# Patient Record
Sex: Male | Born: 1980 | Race: Black or African American | Hispanic: No | Marital: Married | Smoking: Never smoker
Health system: Southern US, Community
[De-identification: ages and names within clinical notes are randomized; demographics above are authoritative.]

## PROBLEM LIST (undated history)

## (undated) DIAGNOSIS — E66813 Obesity, class 3: Secondary | ICD-10-CM

## (undated) DIAGNOSIS — I509 Heart failure, unspecified: Secondary | ICD-10-CM

## (undated) DIAGNOSIS — I10 Essential (primary) hypertension: Secondary | ICD-10-CM

## (undated) DIAGNOSIS — E785 Hyperlipidemia, unspecified: Secondary | ICD-10-CM

## (undated) DIAGNOSIS — M1990 Unspecified osteoarthritis, unspecified site: Secondary | ICD-10-CM

## (undated) HISTORY — DX: Heart failure, unspecified: I50.9

## (undated) HISTORY — PX: ANKLE SURGERY: SHX546

## (undated) HISTORY — DX: Obesity, class 3: E66.813

## (undated) HISTORY — DX: Hyperlipidemia, unspecified: E78.5

## (undated) HISTORY — PX: OTHER SURGICAL HISTORY: SHX169

## (undated) HISTORY — DX: Morbid (severe) obesity due to excess calories: E66.01

## (undated) HISTORY — DX: Unspecified osteoarthritis, unspecified site: M19.90

## (undated) HISTORY — DX: Essential (primary) hypertension: I10

---

## 2004-05-02 ENCOUNTER — Emergency Department: Payer: Self-pay | Admitting: Emergency Medicine

## 2005-10-16 ENCOUNTER — Ambulatory Visit: Payer: Self-pay | Admitting: Orthopaedic Surgery

## 2008-08-02 ENCOUNTER — Emergency Department: Payer: Self-pay | Admitting: Emergency Medicine

## 2010-04-30 ENCOUNTER — Emergency Department: Payer: Self-pay | Admitting: Unknown Physician Specialty

## 2010-11-26 ENCOUNTER — Emergency Department: Payer: Self-pay | Admitting: Emergency Medicine

## 2012-05-09 ENCOUNTER — Emergency Department: Payer: Self-pay | Admitting: Unknown Physician Specialty

## 2012-05-09 LAB — CK TOTAL AND CKMB (NOT AT ARMC): CK-MB: 2.4 ng/mL (ref 0.5–3.6)

## 2012-05-09 LAB — COMPREHENSIVE METABOLIC PANEL
Anion Gap: 7 (ref 7–16)
Bilirubin,Total: 0.4 mg/dL (ref 0.2–1.0)
Calcium, Total: 8.9 mg/dL (ref 8.5–10.1)
Chloride: 103 mmol/L (ref 98–107)
Creatinine: 1.21 mg/dL (ref 0.60–1.30)
Glucose: 83 mg/dL (ref 65–99)
Potassium: 3.8 mmol/L (ref 3.5–5.1)
SGPT (ALT): 36 U/L (ref 12–78)
Total Protein: 7.6 g/dL (ref 6.4–8.2)

## 2012-05-09 LAB — CBC
MCH: 28.1 pg (ref 26.0–34.0)
Platelet: 216 10*3/uL (ref 150–440)
RBC: 4.73 10*6/uL (ref 4.40–5.90)
RDW: 12.9 % (ref 11.5–14.5)

## 2013-04-05 ENCOUNTER — Emergency Department: Payer: Self-pay | Admitting: Emergency Medicine

## 2013-04-06 LAB — CBC
HCT: 40.8 % (ref 40.0–52.0)
HGB: 13.7 g/dL (ref 13.0–18.0)
MCH: 29.1 pg (ref 26.0–34.0)
MCHC: 33.6 g/dL (ref 32.0–36.0)
MCV: 87 fL (ref 80–100)
Platelet: 233 10*3/uL (ref 150–440)
RBC: 4.7 10*6/uL (ref 4.40–5.90)
RDW: 13 % (ref 11.5–14.5)
WBC: 7.9 10*3/uL (ref 3.8–10.6)

## 2013-04-06 LAB — BASIC METABOLIC PANEL
ANION GAP: 4 — AB (ref 7–16)
BUN: 17 mg/dL (ref 7–18)
CALCIUM: 9.1 mg/dL (ref 8.5–10.1)
Chloride: 105 mmol/L (ref 98–107)
Co2: 29 mmol/L (ref 21–32)
Creatinine: 1.18 mg/dL (ref 0.60–1.30)
EGFR (African American): >60
GLUCOSE: 88 mg/dL (ref 65–99)
Osmolality: 277 (ref 275–301)
Potassium: 3.7 mmol/L (ref 3.5–5.1)
SODIUM: 138 mmol/L (ref 136–145)

## 2013-04-06 LAB — TROPONIN I

## 2014-04-28 ENCOUNTER — Ambulatory Visit
Admit: 2014-04-28 | Disposition: A | Discharge status: Home / Self Care | Payer: Self-pay | Attending: Family Medicine | Admitting: Family Medicine

## 2014-08-17 ENCOUNTER — Emergency Department: Payer: BLUE CROSS/BLUE SHIELD

## 2014-08-17 ENCOUNTER — Emergency Department
Admission: EM | Admit: 2014-08-17 | Discharge: 2014-08-17 | Disposition: A | Discharge status: Home / Self Care | Payer: BLUE CROSS/BLUE SHIELD | Attending: Student | Admitting: Student

## 2014-08-17 ENCOUNTER — Encounter: Payer: Self-pay | Admitting: Emergency Medicine

## 2014-08-17 DIAGNOSIS — M5441 Lumbago with sciatica, right side: Secondary | ICD-10-CM | POA: Insufficient documentation

## 2014-08-17 DIAGNOSIS — K625 Hemorrhage of anus and rectum: Secondary | ICD-10-CM

## 2014-08-17 DIAGNOSIS — K649 Unspecified hemorrhoids: Secondary | ICD-10-CM | POA: Insufficient documentation

## 2014-08-17 DIAGNOSIS — M545 Low back pain: Secondary | ICD-10-CM | POA: Diagnosis present

## 2014-08-17 MED ORDER — TRAMADOL HCL 50 MG PO TABS
50.0000 mg | ORAL_TABLET | Freq: Four times a day (QID) | ORAL | Status: DC | PRN
Start: 2014-08-17 — End: 2015-08-15

## 2014-08-17 MED ORDER — HYDROCORTISONE ACETATE 25 MG RE SUPP: 25.0000 mg | Freq: Two times a day (BID) | RECTAL | Status: DC

## 2014-08-17 MED ORDER — CYCLOBENZAPRINE HCL 10 MG PO TABS: 10.0000 mg | ORAL_TABLET | Freq: Three times a day (TID) | ORAL | Status: DC | PRN

## 2014-08-17 MED ORDER — IBUPROFEN 800 MG PO TABS: 800.0000 mg | ORAL_TABLET | Freq: Three times a day (TID) | ORAL | Status: DC | PRN

## 2014-08-17 NOTE — ED Notes (Signed)
C/o low back pain past 3 days, also states his hemmoroids are sticking out

## 2014-08-17 NOTE — ED Notes (Signed)
Lower back pain with pain into right leg .

## 2014-08-17 NOTE — ED Notes (Signed)
Patient transported to X-ray 

## 2014-08-17 NOTE — ED Provider Notes (Signed)
[facility] Emergency Department Provider Note  ____________________________________________  Time seen: Approximately 8:39 AM  I have reviewed the triage vital signs and the nursing notes.   HISTORY  Chief Complaint Back Pain    HPI John Lowe is a 34 y.o. male complaining of 3 days of radicular back pain to the right lower extremity. Patient denies any provocative incident for this. Patient denies any loss of bowel or bladder function. Patient admits to couple days of heavy lifting at work. Patient rates pain as 8/10 describes it as being sharp. Denies any loss of strength to the lower extremity. Patient also complaining of rectal hemorrhoids. Patient stated there is no bleeding from hemorrhoids but his discomfort. Patient use over-the-counter medication for hemorrhoids.   History reviewed. No pertinent past medical history.  There are no active problems to display for this patient.   History reviewed. No pertinent past surgical history.  Current Outpatient Rx  Name  Route  Sig  Dispense  Refill  . cyclobenzaprine (FLEXERIL) 10 MG tablet   Oral   Take 1 tablet (10 mg total) by mouth every 8 (eight) hours as needed for muscle spasms.   15 tablet   0   . hydrocortisone (ANUSOL-HC) 25 MG suppository   Rectal   Place 1 suppository (25 mg total) rectally every 12 (twelve) hours.   12 suppository   1   . ibuprofen (ADVIL,MOTRIN) 800 MG tablet   Oral   Take 1 tablet (800 mg total) by mouth every 8 (eight) hours as needed for moderate pain.   15 tablet   0   . traMADol (ULTRAM) 50 MG tablet   Oral   Take 1 tablet (50 mg total) by mouth every 6 (six) hours as needed for moderate pain.   12 tablet   0     Allergies Review of patient's allergies indicates not on file.  No family history on file.  Social History History  Substance Use Topics  . Smoking status: Never Smoker   . Smokeless tobacco: Not on file  . Alcohol Use: No     Review of Systems Constitutional: No fever/chills Eyes: No visual changes. ENT: No sore throat. Cardiovascular: Denies chest pain. Respiratory: Denies shortness of breath. Gastrointestinal: No abdominal pain.  No nausea, no vomiting.  No diarrhea.  No constipation. Discomfort from hemorrhoids. Genitourinary: Negative for dysuria. Musculoskeletal: Positive back pain. Skin: Negative for rash. Neurological: Negative for headaches, focal weakness or numbness. 10-point ROS otherwise negative.  ____________________________________________   PHYSICAL EXAM:  VITAL SIGNS: ED Triage Vitals  Enc Vitals Group     BP 08/17/14 0829 173/95 mmHg     Pulse Rate 08/17/14 0829 80     Resp 08/17/14 0829 20     Temp 08/17/14 0829 97.7 F (36.5 C)     Temp Source 08/17/14 0829 Oral     SpO2 08/17/14 0829 97 %     Weight 08/17/14 0829 345 lb (156.491 kg)     Height 08/17/14 0829  (1.905 m)     Head Cir --      Peak Flow --      Pain Score 08/17/14 0829 8     Pain Loc --      Pain Edu? --      Excl. in GC? --     Constitutional: Alert and oriented. Well appearing and in no acute distress. Eyes: Conjunctivae are normal. PERRL. EOMI. Head: Atraumatic. Nose: No congestion/rhinnorhea. Mouth/Throat: Mucous membranes are moist.  Oropharynx non-erythematous. Neck: No stridor.  No deformity for nuchal range of motion nontender palpation. Hematological/Lymphatic/Immunilogical: No cervical lymphadenopathy. Cardiovascular: Normal rate, regular rhythm. Grossly normal heart sounds.  Good peripheral circulation. Respiratory: Normal respiratory effort.  No retractions. Lungs CTAB. Gastrointestinal: Soft and nontender. No distention. No abdominal bruits. No CVA tenderness. Nonthrombosed rectal hemorrhoid Musculoskeletal: No lower extremity tenderness nor edema.  No joint effusions. Neurologic:  Normal speech and language. No gross focal neurologic deficits are appreciated. Speech is normal. No  gait instability. Skin:  Skin is warm, dry and intact. No rash noted. Psychiatric: Mood and affect are normal. Speech and behavior are normal.  ____________________________________________   LABS (all labs ordered are listed, but only abnormal results are displayed)  Labs Reviewed - No data to display ____________________________________________  EKG   ____________________________________________  RADIOLOGY no acute findings ____________________________________________   PROCEDURES  Procedure(s) performed: None  Critical Care performed: No  ____________________________________________   INITIAL IMPRESSION / ASSESSMENT AND PLAN / ED COURSE  Pertinent labs & imaging results that were available during my care of the patient were reviewed by me and considered in my medical decision making (see chart for details).  Radicular low back pain. Rectal hemorrhoids. ____________________________________________   FINAL CLINICAL IMPRESSION(S) / ED DIAGNOSES  Final diagnoses:  Right-sided low back pain with right-sided sciatica  Hemorrhage, anal or rectal      Joni Reining, PA-C 08/17/14 2229  Gayla Doss, MD 08/19/14 [phone]

## 2014-08-17 NOTE — Discharge Instructions (Signed)
Back Pain, Adult °Back pain is very common. The pain often gets better over time. The cause of back pain is usually not dangerous. Most people can learn to manage their back pain on their own.  °HOME CARE  °· Stay active. Start with short walks on flat ground if you can. Try to walk farther each day. °· Do not sit, drive, or stand in one place for more than 30 minutes. Do not stay in bed. °· Do not avoid exercise or work. Activity can help your back heal faster. °· Be careful when you bend or lift an object. Bend at your knees, keep the object close to you, and do not twist. °· Sleep on a firm mattress. Lie on your side, and bend your knees. If you lie on your back, put a pillow under your knees. °· Only take medicines as told by your doctor. °· Put ice on the injured area. °¨ Put ice in a plastic bag. °¨ Place a towel between your skin and the bag. °¨ Leave the ice on for 15-20 minutes, 03-04 times a day for the first 2 to 3 days. After that, you can switch between ice and heat packs. °· Ask your doctor about back exercises or massage. °· Avoid feeling anxious or stressed. Find good ways to deal with stress, such as exercise. °GET HELP RIGHT AWAY IF:  °· Your pain does not go away with rest or medicine. °· Your pain does not go away in 1 week. °· You have new problems. °· You do not feel well. °· The pain spreads into your legs. °· You cannot control when you poop (bowel movement) or pee (urinate). °· Your arms or legs feel weak or lose feeling (numbness). °· You feel sick to your stomach (nauseous) or throw up (vomit). °· You have belly (abdominal) pain. °· You feel like you may pass out (faint). °MAKE SURE YOU:  °· Understand these instructions. °· Will watch your condition. °· Will get help right away if you are not doing well or get worse. °Document Released: 08/01/2007 Document Revised: 05/07/2011 Document Reviewed: 06/16/2013 °ExitCare® Patient Information ©2015 [employer]. This information is not intended  to replace advice given to you by your health care provider. Make sure you discuss any questions you have with your health care provider. ° °

## 2014-08-31 ENCOUNTER — Ambulatory Visit (INDEPENDENT_AMBULATORY_CARE_PROVIDER_SITE_OTHER): Payer: BLUE CROSS/BLUE SHIELD | Admitting: Family Medicine

## 2014-08-31 ENCOUNTER — Encounter: Payer: Self-pay | Admitting: Family Medicine

## 2014-08-31 VITALS — BP 138/88 | HR 93 | Temp 97.7°F | Resp 18 | Ht 75.0 in | Wt 357.2 lb

## 2014-08-31 DIAGNOSIS — Z0000 Encounter for general adult medical examination without abnormal findings: Secondary | ICD-10-CM | POA: Diagnosis not present

## 2014-08-31 DIAGNOSIS — E669 Obesity, unspecified: Secondary | ICD-10-CM

## 2014-08-31 NOTE — Patient Instructions (Signed)

## 2014-08-31 NOTE — Progress Notes (Signed)
Name: John Lowe   MRN: [id number]    DOB: February 22, 1981   Date:08/31/2014       Progress Note  Subjective  Chief Complaint  Chief Complaint  Patient presents with  . Annual Exam    HPI  34 year old male who presents for H&P. He has a long-standing history of hypertension with intermittent noncompliance. He also has morbid obesity and has little motivation or weight loss. He has been referred to dietitian before given information is not followed at [facility] rectal exercise on a regular basis.  Patient status had a recent visit to the emergency room for sciatica and hemorrhoids which are both improved.  Past Medical History  Diagnosis Date  . Hypertension   . Hyperlipidemia   . Obesity, Class III, BMI 40-49.9 (morbid obesity)     History  Substance Use Topics  . Smoking status: Never Smoker   . Smokeless tobacco: Not on file  . Alcohol Use: No     Current outpatient prescriptions:  .  cyclobenzaprine (FLEXERIL) 10 MG tablet, Take 1 tablet (10 mg total) by mouth every 8 (eight) hours as needed for muscle spasms., Disp: 15 tablet, Rfl: 0 .  fluticasone (FLONASE) 50 MCG/ACT nasal spray, , Disp: , Rfl: 1 .  hydrocortisone (ANUSOL-HC) 25 MG suppository, Place 1 suppository (25 mg total) rectally every 12 (twelve) hours., Disp: 12 suppository, Rfl: 1 .  ibuprofen (ADVIL,MOTRIN) 800 MG tablet, Take 1 tablet (800 mg total) by mouth every 8 (eight) hours as needed for moderate pain., Disp: 15 tablet, Rfl: 0 .  traMADol (ULTRAM) 50 MG tablet, Take 1 tablet (50 mg total) by mouth every 6 (six) hours as needed for moderate pain., Disp: 12 tablet, Rfl: 0 .  TRIBENZOR 40-10-25 MG TABS, , Disp: , Rfl: 0  No Known Allergies  Review of Systems  Constitutional: Negative for fever, chills and weight loss.       Morbidly obese  HENT: Negative for congestion, hearing loss, sore throat and tinnitus.   Eyes: Negative for blurred vision, double vision and redness.  Respiratory: Negative for cough,  hemoptysis and shortness of breath.   Cardiovascular: Negative for chest pain, palpitations, orthopnea, claudication and leg swelling.  Gastrointestinal: Negative for heartburn, nausea, vomiting, diarrhea, constipation and blood in stool.       Hemorrhoids  Genitourinary: Negative for dysuria, urgency, frequency and hematuria.  Musculoskeletal: Positive for back pain. Negative for myalgias, joint pain, falls and neck pain.  Skin: Negative for itching.  Neurological: Negative for dizziness, tingling, tremors, focal weakness, seizures, loss of consciousness, weakness and headaches.  Endo/Heme/Allergies: Does not bruise/bleed easily.  Psychiatric/Behavioral: Negative for depression and substance abuse. The patient is not nervous/anxious and does not have insomnia.      Objective  Filed Vitals:   08/31/14 1341  BP: 138/88  Pulse: 93  Temp: 97.7 F (36.5 C)  Resp: 18  Height:  (1.905 m)  Weight: 357 lb 4 oz (162.048 kg)  SpO2: 99%     Physical Exam  Constitutional: He is oriented to person, place, and time and well-developed, well-nourished, and in no distress.  Morbidly obese  HENT:  Head: Normocephalic.  Eyes: EOM are normal. Pupils are equal, round, and reactive to light.  Neck: Normal range of motion. Neck supple. No thyromegaly present.  Cardiovascular: Normal rate, regular rhythm and normal heart sounds.   No murmur heard. Pulmonary/Chest: Effort normal and breath sounds normal. No respiratory distress. He has no wheezes.  Abdominal: Soft. Bowel sounds are  normal.  Genitourinary: Penis normal. No discharge found.  Musculoskeletal: Normal range of motion. He exhibits no edema.  Lymphadenopathy:    He has no cervical adenopathy.  Neurological: He is alert and oriented to person, place, and time. No cranial nerve deficit. Gait normal. Coordination normal.  Skin: Skin is warm and dry. No rash noted.  Psychiatric: Affect and judgment normal.  Vitals  reviewed.     Assessment & Plan  1. Annual physical exam - CBC - Comprehensive metabolic panel - Lipid panel - TSH  2. Obesity

## 2014-09-15 ENCOUNTER — Other Ambulatory Visit: Payer: Self-pay | Admitting: Family Medicine

## 2014-09-24 ENCOUNTER — Other Ambulatory Visit: Payer: Self-pay

## 2014-09-24 DIAGNOSIS — E785 Hyperlipidemia, unspecified: Secondary | ICD-10-CM

## 2014-09-24 LAB — LIPID PANEL
Chol/HDL Ratio: 4.5 ratio units (ref 0.0–5.0)
Cholesterol, Total: 208 mg/dL — ABNORMAL HIGH (ref 100–199)
HDL: 46 mg/dL (ref >39)
LDL CALC: 149 mg/dL — AB (ref 0–99)
Triglycerides: 65 mg/dL (ref 0–149)
VLDL Cholesterol Cal: 13 mg/dL (ref 5–40)

## 2014-09-24 LAB — CBC
HEMATOCRIT: 40 % (ref 37.5–51.0)
Hemoglobin: 13.8 g/dL (ref 12.6–17.7)
MCH: 28.7 pg (ref 26.6–33.0)
MCHC: 34.5 g/dL (ref 31.5–35.7)
MCV: 83 fL (ref 79–97)
Platelets: 284 10*3/uL (ref 150–379)
RBC: 4.81 x10E6/uL (ref 4.14–5.80)
RDW: 13.6 % (ref 12.3–15.4)
WBC: 7.1 10*3/uL (ref 3.4–10.8)

## 2014-09-24 LAB — COMPREHENSIVE METABOLIC PANEL
A/G RATIO: 1.4 (ref 1.1–2.5)
ALBUMIN: 4.2 g/dL (ref 3.5–5.5)
ALT: 28 IU/L (ref 0–44)
AST: 20 IU/L (ref 0–40)
Alkaline Phosphatase: 83 IU/L (ref 39–117)
BUN / CREAT RATIO: 12 (ref 8–19)
BUN: 12 mg/dL (ref 6–20)
Bilirubin Total: 0.4 mg/dL (ref 0.0–1.2)
CALCIUM: 9.8 mg/dL (ref 8.7–10.2)
CO2: 25 mmol/L (ref 18–29)
Chloride: 97 mmol/L (ref 97–108)
Creatinine, Ser: 1.03 mg/dL (ref 0.76–1.27)
GFR calc Af Amer: 108 mL/min/{1.73_m2} (ref >59)
GFR, EST NON AFRICAN AMERICAN: 94 mL/min/{1.73_m2} (ref >59)
GLOBULIN, TOTAL: 2.9 g/dL (ref 1.5–4.5)
Glucose: 86 mg/dL (ref 65–99)
Potassium: 4.2 mmol/L (ref 3.5–5.2)
Sodium: 138 mmol/L (ref 134–144)
TOTAL PROTEIN: 7.1 g/dL (ref 6.0–8.5)

## 2014-09-24 LAB — TSH: TSH: 1.54 u[IU]/mL (ref 0.450–4.500)

## 2014-09-24 MED ORDER — ATORVASTATIN CALCIUM 20 MG PO TABS: 20.0000 mg | ORAL_TABLET | Freq: Every day | ORAL | Status: DC

## 2014-09-30 ENCOUNTER — Ambulatory Visit: Payer: Self-pay | Admitting: Family Medicine

## 2014-10-07 ENCOUNTER — Ambulatory Visit: Payer: Self-pay | Admitting: Family Medicine

## 2014-12-02 ENCOUNTER — Ambulatory Visit: Payer: BLUE CROSS/BLUE SHIELD | Admitting: Family Medicine

## 2014-12-16 ENCOUNTER — Ambulatory Visit: Payer: BLUE CROSS/BLUE SHIELD | Admitting: Family Medicine

## 2015-01-17 ENCOUNTER — Other Ambulatory Visit: Payer: Self-pay | Admitting: Family Medicine

## 2015-03-08 ENCOUNTER — Other Ambulatory Visit: Payer: Self-pay | Admitting: Family Medicine

## 2015-03-15 ENCOUNTER — Other Ambulatory Visit: Payer: Self-pay | Admitting: Family Medicine

## 2015-03-15 ENCOUNTER — Ambulatory Visit: Payer: BLUE CROSS/BLUE SHIELD | Admitting: Family Medicine

## 2015-03-15 MED ORDER — OLMESARTAN-AMLODIPINE-HCTZ 40-10-25 MG PO TABS: 1.0000 | ORAL_TABLET | Freq: Every day | ORAL | Status: DC

## 2015-03-29 ENCOUNTER — Ambulatory Visit: Payer: BLUE CROSS/BLUE SHIELD | Admitting: Family Medicine

## 2015-04-28 ENCOUNTER — Other Ambulatory Visit: Payer: Self-pay | Admitting: Family Medicine

## 2015-05-23 ENCOUNTER — Ambulatory Visit: Payer: BLUE CROSS/BLUE SHIELD | Admitting: Family Medicine

## 2015-06-01 ENCOUNTER — Telehealth: Payer: Self-pay | Admitting: Family Medicine

## 2015-06-01 ENCOUNTER — Other Ambulatory Visit: Payer: Self-pay | Admitting: Family Medicine

## 2015-06-01 DIAGNOSIS — E785 Hyperlipidemia, unspecified: Secondary | ICD-10-CM

## 2015-06-01 MED ORDER — ATORVASTATIN CALCIUM 20 MG PO TABS: 20.0000 mg | ORAL_TABLET | Freq: Every day | ORAL | Status: DC

## 2015-06-01 NOTE — Telephone Encounter (Signed)
Left message to inform patient that prescription has been sent to the pharmacy

## 2015-06-01 NOTE — Telephone Encounter (Signed)
90 day supply has been sent to pt pharmacy

## 2015-06-01 NOTE — Telephone Encounter (Signed)
[facility] [address]

## 2015-07-17 ENCOUNTER — Other Ambulatory Visit: Payer: Self-pay | Admitting: Family Medicine

## 2015-08-04 ENCOUNTER — Telehealth: Payer: Self-pay | Admitting: Family Medicine

## 2015-08-04 MED ORDER — OLMESARTAN-AMLODIPINE-HCTZ 40-10-25 MG PO TABS: 1.0000 | ORAL_TABLET | Freq: Every day | ORAL | Status: DC

## 2015-08-04 NOTE — Telephone Encounter (Signed)
Have appointment with Dr Hollace HaywardPlonk for medication refill on 08-15-15. Would like to know if you can send enough of his blood pressure medication (he could not remember the name of the medication) [facility]

## 2015-08-04 NOTE — Telephone Encounter (Signed)
Sent enough to last him until his appointment. Please inform pt

## 2015-08-05 NOTE — Telephone Encounter (Signed)
Left voice message informing patient.

## 2015-08-15 ENCOUNTER — Ambulatory Visit (INDEPENDENT_AMBULATORY_CARE_PROVIDER_SITE_OTHER): Payer: BLUE CROSS/BLUE SHIELD | Admitting: Family Medicine

## 2015-08-15 ENCOUNTER — Encounter: Payer: Self-pay | Admitting: Family Medicine

## 2015-08-15 VITALS — BP 140/110 | HR 98 | Temp 98.0°F | Resp 18 | Ht 75.0 in | Wt 365.2 lb

## 2015-08-15 DIAGNOSIS — I10 Essential (primary) hypertension: Secondary | ICD-10-CM | POA: Diagnosis not present

## 2015-08-15 DIAGNOSIS — J309 Allergic rhinitis, unspecified: Secondary | ICD-10-CM

## 2015-08-15 DIAGNOSIS — E785 Hyperlipidemia, unspecified: Secondary | ICD-10-CM

## 2015-08-15 MED ORDER — OLMESARTAN-AMLODIPINE-HCTZ 40-10-25 MG PO TABS: 1.0000 | ORAL_TABLET | Freq: Every day | ORAL | Status: DC

## 2015-08-15 MED ORDER — METOPROLOL SUCCINATE ER 50 MG PO TB24: 100.0000 mg | ORAL_TABLET | Freq: Every day | ORAL | Status: DC

## 2015-08-15 MED ORDER — FLUTICASONE PROPIONATE 50 MCG/ACT NA SUSP: 1.0000 | Freq: Two times a day (BID) | NASAL | Status: DC

## 2015-08-16 LAB — LIPID PANEL
Cholesterol: 236 mg/dL — ABNORMAL HIGH (ref 125–200)
HDL: 43 mg/dL (ref >=40)
LDL Cholesterol: 168 mg/dL — ABNORMAL HIGH (ref <130)
Total CHOL/HDL Ratio: 5.5 Ratio — ABNORMAL HIGH (ref <=5.0)
Triglycerides: 123 mg/dL (ref <150)
VLDL: 25 mg/dL (ref <30)

## 2015-08-16 LAB — COMPREHENSIVE METABOLIC PANEL
ALT: 26 U/L (ref 9–46)
AST: 21 U/L (ref 10–40)
Albumin: 4.4 g/dL (ref 3.6–5.1)
Alkaline Phosphatase: 71 U/L (ref 40–115)
BILIRUBIN TOTAL: 0.5 mg/dL (ref 0.2–1.2)
BUN: 15 mg/dL (ref 7–25)
CHLORIDE: 101 mmol/L (ref 98–110)
CO2: 28 mmol/L (ref 20–31)
Calcium: 9.8 mg/dL (ref 8.6–10.3)
Creat: 1.37 mg/dL — ABNORMAL HIGH (ref 0.60–1.35)
Glucose, Bld: 87 mg/dL (ref 65–99)
Potassium: 3.7 mmol/L (ref 3.5–5.3)
Sodium: 141 mmol/L (ref 135–146)
TOTAL PROTEIN: 7.3 g/dL (ref 6.1–8.1)

## 2015-08-16 MED ORDER — ATORVASTATIN CALCIUM 40 MG PO TABS: 40.0000 mg | ORAL_TABLET | Freq: Every day | ORAL | Status: DC

## 2015-08-16 NOTE — Addendum Note (Signed)
Addended by: Schuyler AmorPLONK, Khiyan Crace on: 08/16/2015 02:56 PM   Modules accepted: Orders, SmartSet

## 2015-08-16 NOTE — Progress Notes (Signed)
Date:  08/15/2015   Name:  John Lowe   DOB:  09/10/1980   MRN:  [id number]  PCP:  Dennison MascotLemont Morrisey, MD    Chief Complaint: Hypertension and Medication Refill   History of Present Illness:  This is a 35 y.o. male seen for one year f/u. Ran out of Tribenzor, off x 1 week, back on now x 5d. On Flonase for AR, working well. On Lipitor for HLD, no blood work since last year. Weight up 8# from last visit.  Review of Systems:  Review of Systems  Constitutional: Negative for fever and fatigue.  Respiratory: Negative for cough and shortness of breath.   Cardiovascular: Negative for chest pain and leg swelling.  Neurological: Negative for syncope and light-headedness.    Patient Active Problem List   Diagnosis Date Noted  . Hypertension 08/15/2015  . Hyperlipidemia 08/15/2015  . Allergic rhinitis 08/15/2015  . Obesity, Class III, BMI 40-49.9 (morbid obesity) (HCC) 08/31/2014    Prior to Admission medications   Medication Sig Start Date End Date Taking? Authorizing Provider  aspirin 81 MG tablet Take 81 mg by mouth daily.   Yes Historical Provider, MD  atorvastatin (LIPITOR) 20 MG tablet Take 1 tablet (20 mg total) by mouth daily. 06/01/15   Dennison MascotLemont Morrisey, MD  fluticasone (FLONASE) 50 MCG/ACT nasal spray Place 1 spray into both nostrils 2 (two) times daily. 08/15/15   Schuyler AmorWilliam Nemiah Bubar, MD  metoprolol succinate (TOPROL-XL) 50 MG 24 hr tablet Take 2 tablets (100 mg total) by mouth daily. Take with or immediately following a meal. 08/15/15   Schuyler AmorWilliam Nocholas Damaso, MD  Olmesartan-Amlodipine-HCTZ 40-10-25 MG TABS Take 1 tablet by mouth daily. 08/15/15   Schuyler AmorWilliam Adamarys Shall, MD    No Known Allergies  Past Surgical History  Procedure Laterality Date  . Ankle surgery    . Rotator cuff Right     Social History  Substance Use Topics  . Smoking status: Never Smoker   . Smokeless tobacco: None  . Alcohol Use: No    Family History  Problem Relation Age of Onset  . Hypertension Mother   . Hypertension  Father     Medication list has been reviewed and updated.  Physical Examination: BP 140/110 mmHg  Pulse 98  Temp(Src) 98 F (36.7 C)  Resp 18  Ht 6\' 3"  (1.905 m)  Wt 365 lb 3 oz (165.648 kg)  BMI 45.65 kg/m2  SpO2 96%  Physical Exam  Constitutional: He appears well-developed and well-nourished.  Cardiovascular: Normal rate, regular rhythm and normal heart sounds.   Pulmonary/Chest: Effort normal and breath sounds normal.  Musculoskeletal: He exhibits no edema.  Neurological: He is alert.  Skin: Skin is warm and dry.  Psychiatric: He has a normal mood and affect. His behavior is normal.  Nursing note and vitals reviewed.   Assessment and Plan:  1. Essential hypertension Poor control on Tribenzor, add metoprolol succinate - Comprehensive metabolic panel  2. Hyperlipidemia Marginal control - Lipid Profile  3. Allergic rhinitis, unspecified allergic rhinitis type Well controlled on Flonase  4. Obesity, Class III, BMI 40-49.9 (morbid obesity) (HCC) Exercise/weight loss discussed  Return in about 4 weeks (around 09/12/2015).  Dionne AnoWilliam M. Kingsley SpittlePlonk, Jr. MD [facility]  08/16/2015

## 2015-09-13 ENCOUNTER — Ambulatory Visit: Payer: BLUE CROSS/BLUE SHIELD | Admitting: Family Medicine

## 2015-09-27 ENCOUNTER — Ambulatory Visit (INDEPENDENT_AMBULATORY_CARE_PROVIDER_SITE_OTHER): Payer: BLUE CROSS/BLUE SHIELD | Admitting: Family Medicine

## 2015-09-27 ENCOUNTER — Encounter: Payer: Self-pay | Admitting: Family Medicine

## 2015-09-27 VITALS — BP 138/90 | HR 70 | Temp 98.3°F | Resp 14 | Wt 362.0 lb

## 2015-09-27 DIAGNOSIS — E785 Hyperlipidemia, unspecified: Secondary | ICD-10-CM

## 2015-09-27 DIAGNOSIS — I10 Essential (primary) hypertension: Secondary | ICD-10-CM | POA: Diagnosis not present

## 2015-09-27 DIAGNOSIS — Z5181 Encounter for therapeutic drug level monitoring: Secondary | ICD-10-CM | POA: Diagnosis not present

## 2015-09-27 DIAGNOSIS — J309 Allergic rhinitis, unspecified: Secondary | ICD-10-CM | POA: Diagnosis not present

## 2015-09-27 MED ORDER — ATORVASTATIN CALCIUM 40 MG PO TABS: 40.0000 mg | ORAL_TABLET | Freq: Every day | ORAL | 2 refills | Status: DC

## 2015-09-27 NOTE — Progress Notes (Signed)
BP 138/90   Pulse 70   Temp 98.3 F (36.8 C) (Oral)   Resp 14   Wt (!) 362 lb (164.2 kg)   SpO2 99%   BMI 45.25 kg/m    Subjective:    Patient ID: John Lowe, male    DOB: February 09, 1981, 35 y.o.   MRN: [id number]  HPI: John Lowe is a 35 y.o. male  Chief Complaint  Patient presents with  . 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  High cholesterol; runs in the family; on statin, muscle aches when he first started, doing better now; occasion or rare eggs, bacon once a week; peanut butter; loves cheese; loves pasta once a week; Dr. Hollace Hayward increased from 20 mg to 40 mg daily Lab Results  Component Value Date   CHOL 236 (H) 08/15/2015   CHOL 208 (H) 09/23/2014   Lab Results  Component Value Date   HDL 43 08/15/2015   HDL 46 09/23/2014   Lab Results  Component Value Date   LDLCALC 168 (H) 08/15/2015   LDLCALC 149 (H) 09/23/2014   Lab Results  Component Value Date   TRIG 123 08/15/2015   TRIG 65 09/23/2014   Lab Results  Component Value Date   CHOLHDL 5.5 (H) 08/15/2015   CHOLHDL 4.5 09/23/2014   No results found for: LDLDIRECT  CKD stage 2; on ibuprofen; good water drinker   Depression screen [facility] 2/9 09/27/2015 08/31/2014  Decreased Interest 0 0  Down, Depressed, Hopeless 0 0  PHQ - 2 Score 0 0   Relevant past medical, surgical, family and social history reviewed Past Medical History:  Diagnosis Date  . Hyperlipidemia   . Hypertension   . Obesity, Class III, BMI 40-49.9 (morbid obesity) (HCC)    Past Surgical History:  Procedure Laterality Date  . ANKLE  SURGERY    . rotator cuff Right    Family History  Problem Relation Age of Onset  . Hypertension Mother   . Hypertension Father    Social History  Substance Use Topics  . Smoking status: Never Smoker  . Smokeless tobacco: Not on file  . Alcohol use No   Interim medical history since last visit reviewed. Allergies and medications reviewed  Review of Systems Per HPI unless specifically indicated above     Objective:    BP 138/90   Pulse 70   Temp 98.3 F (36.8 C) (Oral)   Resp 14   Wt (!) 362 lb (164.2 kg)   SpO2 99%   BMI 45.25 kg/m   Wt Readings from Last 3 Encounters:  09/27/15 (!) 362 lb (164.2 kg)  08/15/15 (!) 365 lb 3 oz (165.6 kg)  08/31/14 (!) 357 lb 4 oz (162 kg)    Physical Exam  Constitutional: He appears well-developed and well-nourished. No distress.  Morbid obesity; weight down 3+ pounds over last 6 weeks  HENT:  Head: Normocephalic and atraumatic.  Eyes: EOM are normal. No scleral icterus.  Neck: No thyromegaly present.  Cardiovascular: Normal rate and regular rhythm.   Pulmonary/Chest:  Effort normal and breath sounds normal.  Abdominal: Soft. Bowel sounds are normal. He exhibits no distension.  Musculoskeletal: He exhibits no edema.  Neurological: Coordination normal.  Skin: Skin is warm and dry. No pallor.  Psychiatric: He has a normal mood and affect. His behavior is normal. Judgment and thought content normal.   Results for orders placed or performed in visit on 08/15/15  Lipid Profile  Result Value Ref Range   Cholesterol 236 (H) 125 - 200 mg/dL   Triglycerides 335 <456 mg/dL   HDL 43 >=25 mg/dL   Total CHOL/HDL Ratio 5.5 (H) <=5.0 Ratio   VLDL 25 <30 mg/dL   LDL Cholesterol 638 (H) <130 mg/dL  Comprehensive metabolic panel  Result Value Ref Range   Sodium 141 135 - 146 mmol/L   Potassium 3.7 3.5 - 5.3 mmol/L   Chloride 101 98 - 110 mmol/L   CO2 28 20 - 31 mmol/L   Glucose, Bld 87 65 - 99 mg/dL   BUN 15 7 - 25 mg/dL   Creat 9.37  (H) 3.42 - 1.35 mg/dL   Total Bilirubin 0.5 0.2 - 1.2 mg/dL   Alkaline Phosphatase 71 40 - 115 U/L   AST 21 10 - 40 U/L   ALT 26 9 - 46 U/L   Total Protein 7.3 6.1 - 8.1 g/dL   Albumin 4.4 3.6 - 5.1 g/dL   Calcium 9.8 8.6 - 87.6 mg/dL      Assessment & Plan:   Problem List Items Addressed This Visit      Cardiovascular and Mediastinum   Hypertension - Primary (Chronic)    Currently not controlled; diastolic needs to be less than 90; try DASH guidelines; discussed goal BP; avoid decongestants; limit salt/sodium in diet; work on weight loss; explained that if he can lose significant weight, we can start to reduce BP meds      Relevant Medications   atorvastatin (LIPITOR) 40 MG tablet     Respiratory   Allergic rhinitis (Chronic)    Avoid decongestants; plain antihistamines should not affect BP        Other   Obesity, Class III, BMI 40-49.9 (morbid obesity) (HCC) (Chronic)    Encouragement given to work on weight loss; see AVS      Relevant Orders   TSH   Medication monitoring encounter    Monitor sgpt on statin      Relevant Orders   COMPLETE METABOLIC PANEL WITH GFR   Hyperlipidemia (Chronic)    Limit eggs, saturated fats; work on weight loss; statin; monitor LDL and SGPT in 6-8 weeks; currently, LDL not controlled      Relevant Medications   atorvastatin (LIPITOR) 40 MG tablet   Other Relevant Orders   Lipid panel    Other Visit Diagnoses   None.     Follow up plan: Return in about 3 months (around 12/28/2015) for follow-up.  An after-visit summary was printed and given to the patient at check-out.  Please see the patient instructions which may contain other information and recommendations beyond what is mentioned above in the assessment and plan.  Meds ordered this encounter  Medications  . atorvastatin (LIPITOR) 40 MG tablet    Sig: Take 1 tablet (40 mg total) by mouth at bedtime.    Dispense:  30 tablet    Refill:  2    Orders Placed This Encounter   Procedures  . COMPLETE METABOLIC PANEL WITH GFR  . Lipid panel  . TSH

## 2015-09-27 NOTE — Patient Instructions (Addendum)
Try to use PLAIN allergy medicine without the decongestant Avoid: phenylephrine, phenylpropanolamine, and pseudoephredine  Your goal blood pressure is less than 140 mmHg on top. Try to follow the DASH guidelines (DASH stands for Dietary Approaches to Stop Hypertension) Try to limit the sodium in your diet.  Ideally, consume less than 1.5 grams (less than 1,500mg ) per day. Do not add salt when cooking or at the table.  Check the sodium amount on labels when shopping, and choose items lower in sodium when given a choice. Avoid or limit foods that already contain a lot of sodium. Eat a diet rich in fruits and vegetables and whole grains.  Try cutting down to 1%  Return in the next 3 weeks for fasting labs, okay to have medicine  Check out the information at familydoctor.org entitled "Nutrition for Weight Loss: What You Need to Know about Fad Diets" Try to lose between 1-2 pounds per week by taking in fewer calories and burning off more calories You can succeed by limiting portions, limiting foods dense in calories and fat, becoming more active, and drinking 8 glasses of water a day (64 ounces) Don't skip meals, especially breakfast, as skipping meals may alter your metabolism Do not use over-the-counter weight loss pills or gimmicks that claim rapid weight loss A healthy BMI (or body mass index) is between 18.5 and 24.9 You can calculate your ideal BMI at the NIH website JobEconomics.hu   If you need something for aches or pains, try to use Tylenol (acetaminophen) instead of non-steroidals (which include Aleve, ibuprofen, Advil, Motrin, and naproxen); non-steroidals can cause long-term kidney damage  Take the atorvastatin at NIGHT to get more bang for your buck  DASH Eating Plan DASH stands for "Dietary Approaches to Stop Hypertension." The DASH eating plan is a healthy eating plan that has been shown to reduce high blood pressure  (hypertension). Additional health benefits may include reducing the risk of type 2 diabetes mellitus, heart disease, and stroke. The DASH eating plan may also help with weight loss. WHAT DO I NEED TO KNOW ABOUT THE DASH EATING PLAN? For the DASH eating plan, you will follow these general guidelines:  Choose foods with a percent daily value for sodium of less than 5% (as listed on the food label).  Use salt-free seasonings or herbs instead of table salt or sea salt.  Check with your health care provider or pharmacist before using salt substitutes.  Eat lower-sodium products, often labeled as "lower sodium" or "no salt added."  Eat fresh foods.  Eat more vegetables, fruits, and low-fat dairy products.  Choose whole grains. Look for the word "whole" as the first word in the ingredient list.  Choose fish and skinless chicken or [place] more often than red meat. Limit fish, poultry, and meat to 6 oz (170 g) each day.  Limit sweets, desserts, sugars, and sugary drinks.  Choose heart-healthy fats.  Limit cheese to 1 oz (28 g) per day.  Eat more home-cooked food and less restaurant, buffet, and fast food.  Limit fried foods.  Cook foods using methods other than frying.  Limit canned vegetables. If you do use them, rinse them well to decrease the sodium.  When eating at a restaurant, ask that your food be prepared with less salt, or no salt if possible. WHAT FOODS CAN I EAT? Seek help from a dietitian for individual calorie needs. Grains Whole grain or whole wheat bread. Brown rice. Whole grain or whole wheat pasta. Quinoa, bulgur, and whole grain  cereals. Low-sodium cereals. Corn or whole wheat flour tortillas. Whole grain cornbread. Whole grain crackers. Low-sodium crackers. Vegetables Fresh or frozen vegetables (raw, steamed, roasted, or grilled). Low-sodium or reduced-sodium tomato and vegetable juices. Low-sodium or reduced-sodium tomato sauce and paste. Low-sodium or  reduced-sodium canned vegetables.  Fruits All fresh, canned (in natural juice), or frozen fruits. Meat and Other Protein Products Ground beef (85% or leaner), grass-fed beef, or beef trimmed of fat. Skinless chicken or [place]. Ground chicken or [place]. Pork trimmed of fat. All fish and seafood. Eggs. Dried beans, peas, or lentils. Unsalted nuts and seeds. Unsalted canned beans. Dairy Low-fat dairy products, such as skim or 1% milk, 2% or reduced-fat cheeses, low-fat ricotta or cottage cheese, or plain low-fat yogurt. Low-sodium or reduced-sodium cheeses. Fats and Oils Tub margarines without trans fats. Light or reduced-fat mayonnaise and salad dressings (reduced sodium). Avocado. Safflower, olive, or canola oils. Natural peanut or almond butter. Other Unsalted popcorn and pretzels. The items listed above may not be a complete list of recommended foods or beverages. Contact your dietitian for more options. WHAT FOODS ARE NOT RECOMMENDED? Grains White bread. White pasta. White rice. Refined cornbread. Bagels and croissants. Crackers that contain trans fat. Vegetables Creamed or fried vegetables. Vegetables in a cheese sauce. Regular canned vegetables. Regular canned tomato sauce and paste. Regular tomato and vegetable juices. Fruits Dried fruits. Canned fruit in light or heavy syrup. Fruit juice. Meat and Other Protein Products Fatty cuts of meat. Ribs, chicken wings, bacon, sausage, bologna, salami, chitterlings, fatback, hot dogs, bratwurst, and packaged luncheon meats. Salted nuts and seeds. Canned beans with salt. Dairy Whole or 2% milk, cream, half-and-half, and cream cheese. Whole-fat or sweetened yogurt. Full-fat cheeses or blue cheese. Nondairy creamers and whipped toppings. Processed cheese, cheese spreads, or cheese curds. Condiments Onion and garlic salt, seasoned salt, table salt, and sea salt. Canned and packaged gravies. Worcestershire sauce. Tartar sauce. Barbecue sauce. Teriyaki  sauce. Soy sauce, including reduced sodium. Steak sauce. Fish sauce. Oyster sauce. Cocktail sauce. Horseradish. Ketchup and mustard. Meat flavorings and tenderizers. Bouillon cubes. Hot sauce. Tabasco sauce. Marinades. Taco seasonings. Relishes. Fats and Oils Butter, stick margarine, lard, shortening, ghee, and bacon fat. Coconut, palm kernel, or palm oils. Regular salad dressings. Other Pickles and olives. Salted popcorn and pretzels. The items listed above may not be a complete list of foods and beverages to avoid. Contact your dietitian for more information. WHERE CAN I FIND MORE INFORMATION? National Heart, Lung, and Blood Institute: CablePromo.it   This information is not intended to replace advice given to you by your health care provider. Make sure you discuss any questions you have with your health care provider.   Document Released: 02/01/2011 Document Revised: 03/05/2014 Document Reviewed: 12/17/2012 Elsevier Interactive Patient Education [employer]

## 2015-10-02 DIAGNOSIS — Z5181 Encounter for therapeutic drug level monitoring: Secondary | ICD-10-CM | POA: Insufficient documentation

## 2015-10-02 NOTE — Assessment & Plan Note (Addendum)
Currently not controlled; diastolic needs to be less than 90; try DASH guidelines; discussed goal BP; avoid decongestants; limit salt/sodium in diet; work on weight loss; explained that if he can lose significant weight, we can start to reduce BP meds

## 2015-10-02 NOTE — Assessment & Plan Note (Signed)
Avoid decongestants; plain antihistamines should not affect BP

## 2015-10-02 NOTE — Assessment & Plan Note (Signed)
Encouragement given to work on weight loss; see AVS 

## 2015-10-02 NOTE — Assessment & Plan Note (Addendum)
Limit eggs, saturated fats; work on weight loss; statin; monitor LDL and SGPT in 6-8 weeks; currently, LDL not controlled

## 2015-10-02 NOTE — Assessment & Plan Note (Signed)
Monitor sgpt on statin 

## 2015-12-15 ENCOUNTER — Other Ambulatory Visit: Payer: Self-pay | Admitting: Family Medicine

## 2015-12-29 ENCOUNTER — Ambulatory Visit: Payer: BLUE CROSS/BLUE SHIELD | Admitting: Family Medicine

## 2016-01-24 ENCOUNTER — Ambulatory Visit: Payer: BLUE CROSS/BLUE SHIELD | Admitting: Family Medicine

## 2016-01-31 ENCOUNTER — Ambulatory Visit (INDEPENDENT_AMBULATORY_CARE_PROVIDER_SITE_OTHER): Payer: BLUE CROSS/BLUE SHIELD | Admitting: Family Medicine

## 2016-01-31 ENCOUNTER — Encounter: Payer: Self-pay | Admitting: Family Medicine

## 2016-01-31 VITALS — BP 160/102 | HR 77 | Temp 99.3°F | Resp 14 | Wt 379.0 lb

## 2016-01-31 DIAGNOSIS — J029 Acute pharyngitis, unspecified: Secondary | ICD-10-CM | POA: Diagnosis not present

## 2016-01-31 DIAGNOSIS — E782 Mixed hyperlipidemia: Secondary | ICD-10-CM

## 2016-01-31 DIAGNOSIS — I10 Essential (primary) hypertension: Secondary | ICD-10-CM

## 2016-01-31 DIAGNOSIS — Z5181 Encounter for therapeutic drug level monitoring: Secondary | ICD-10-CM | POA: Diagnosis not present

## 2016-01-31 DIAGNOSIS — J302 Other seasonal allergic rhinitis: Secondary | ICD-10-CM

## 2016-01-31 LAB — LIPID PANEL
CHOL/HDL RATIO: 2.8 ratio (ref <5.0)
CHOLESTEROL: 125 mg/dL (ref <200)
HDL: 44 mg/dL (ref >40)
LDL Cholesterol: 66 mg/dL (ref <100)
TRIGLYCERIDES: 75 mg/dL (ref <150)
VLDL: 15 mg/dL (ref <30)

## 2016-01-31 LAB — COMPLETE METABOLIC PANEL WITH GFR
ALK PHOS: 110 U/L (ref 40–115)
ALT: 41 U/L (ref 9–46)
AST: 25 U/L (ref 10–40)
Albumin: 3.9 g/dL (ref 3.6–5.1)
BUN: 11 mg/dL (ref 7–25)
CALCIUM: 9.3 mg/dL (ref 8.6–10.3)
CHLORIDE: 102 mmol/L (ref 98–110)
CO2: 28 mmol/L (ref 20–31)
Creat: 1.09 mg/dL (ref 0.60–1.35)
GFR, EST NON AFRICAN AMERICAN: 86 mL/min (ref >=60)
Glucose, Bld: 105 mg/dL — ABNORMAL HIGH (ref 65–99)
POTASSIUM: 4.1 mmol/L (ref 3.5–5.3)
Sodium: 138 mmol/L (ref 135–146)
Total Bilirubin: 0.4 mg/dL (ref 0.2–1.2)
Total Protein: 6.8 g/dL (ref 6.1–8.1)

## 2016-01-31 LAB — POCT RAPID STREP A (OFFICE): Rapid Strep A Screen: NEGATIVE

## 2016-01-31 LAB — TSH: TSH: 1.24 mIU/L (ref 0.40–4.50)

## 2016-01-31 MED ORDER — OLMESARTAN-AMLODIPINE-HCTZ 40-10-25 MG PO TABS: 1.0000 | ORAL_TABLET | Freq: Every day | ORAL | 2 refills | Status: DC

## 2016-01-31 MED ORDER — METOPROLOL SUCCINATE ER 50 MG PO TB24: 100.0000 mg | ORAL_TABLET | Freq: Every day | ORAL | 2 refills | Status: DC

## 2016-01-31 NOTE — Assessment & Plan Note (Signed)
Avoid decongestants 

## 2016-01-31 NOTE — Assessment & Plan Note (Signed)
Monitor Cr, SGPT

## 2016-01-31 NOTE — Progress Notes (Signed)
BP (!) 160/102   Pulse 77   Temp 99.3 F (37.4 C) (Oral)   Resp 14   Wt (!) 379 lb (171.9 kg)   SpO2 98%   BMI 47.37 kg/m    Subjective:    Patient ID: John Lowe, male    DOB: 09/19/1980, 35 y.o.   MRN: [id number]  HPI: John Lowe is a 35 y.o. male  Chief Complaint  Patient presents with  . Hypertension    F/U   Patient is here for f/u of hypertension He has HTN; he has been off of the combo for a good 3 weeks or so (just not taking the 3 drug combo Azor), but he's still taking beta-blocker No decongestants No black licorice or jelly beans No adding salt No chest pain, no visual changes Both parents side have HTN  High cholesterol; on statin; no bad muscle aches; trying to limit fatty meats  He has morbid obesity, and has unfortunately gained weight since last visit; no thyroid trouble in the family; no constipated; no leg edema  He c/o sore throat; has not been taking OTC cold medicines; no fevers  [facility] 09/27/2015 08/31/2014  Decreased Interest 0 0  Down, Depressed, Hopeless 0 0  PHQ - 2 Score 0 0   Relevant past medical, surgical, family and social history reviewed Past Medical History:  Diagnosis Date  . Hyperlipidemia   . Hypertension   . Obesity, Class III, BMI 40-49.9 (morbid obesity) (HCC)    Past Surgical History:  Procedure Laterality Date  . ANKLE SURGERY    . rotator cuff Right    Family History  Problem Relation Age of Onset  . Hypertension Mother   . Hypertension Father   MD note: high cholesterol too  Social History  Substance Use Topics  . Smoking status: Never Smoker  . Smokeless tobacco: Never Used  . Alcohol use No    Interim medical history since last visit reviewed. Allergies and medications reviewed  Review of Systems Per HPI unless specifically indicated above     Objective:    BP (!) 160/102   Pulse 77   Temp 99.3 F (37.4 C) (Oral)   Resp 14   Wt (!) 379 lb (171.9 kg)   SpO2 98%    BMI 47.37 kg/m   Wt Readings from Last 3 Encounters:  01/31/16 (!) 379 lb (171.9 kg)  09/27/15 (!) 362 lb (164.2 kg)  08/15/15 (!) 365 lb 3 oz (165.6 kg)    Physical Exam  Constitutional: He appears well-developed and well-nourished. No distress.  Morbid obesity; weight gain 15 pounds over 4 months  HENT:  Head: Normocephalic and atraumatic.  Mouth/Throat: Posterior oropharyngeal edema and posterior oropharyngeal erythema present.  Eyes: EOM are normal. No scleral icterus.  Neck: No thyromegaly present.  Cardiovascular: Normal rate and regular rhythm.   Pulmonary/Chest: Effort normal and breath sounds normal.  Abdominal: Soft. Bowel sounds are normal. He exhibits no distension.  Musculoskeletal: He exhibits no edema.  Lymphadenopathy:    He has no cervical adenopathy.  Neurological: Coordination normal.  Skin: Skin is warm and dry. No pallor.  Psychiatric: He has a normal mood and affect. His behavior is normal. Judgment and thought content normal.      Assessment & Plan:   Problem List Items Addressed This Visit      Cardiovascular and Mediastinum   Hypertension (Chronic)    Try DASH guidelines; work on weight loss; start back on meds; check by  here next week for free BP check with CMA      Relevant Medications   Olmesartan-Amlodipine-HCTZ 40-10-25 MG TABS   metoprolol succinate (TOPROL-XL) 50 MG 24 hr tablet     Respiratory   Allergic rhinitis (Chronic)    Avoid decongestants        Other   Obesity, Class III, BMI 40-49.9 (morbid obesity) (HCC) (Chronic)    Urged patient to work on weight loss, check TSH b/c of weight gain; see after visit summary for ideas and strategies to help with weight loss      Relevant Orders   TSH (Completed)   Medication monitoring encounter    Monitor Cr, SGPT      Relevant Orders   COMPLETE METABOLIC PANEL WITH GFR (Completed)   Hyperlipidemia (Chronic)    Avoid saturated fats; work on weight loss; continue statin       Relevant Medications   Olmesartan-Amlodipine-HCTZ 40-10-25 MG TABS   metoprolol succinate (TOPROL-XL) 50 MG 24 hr tablet   Other Relevant Orders   Lipid panel (Completed)    Other Visit Diagnoses    Pharyngitis, unspecified etiology    -  Primary   Relevant Orders   POCT rapid strep A (Completed)   Culture, Group A Strep (Completed)      Follow up plan: Return in about 6 months (around 07/31/2016) for follow=up and fasting labs.  An after-visit summary was printed and given to the patient at check-out.  Please see the patient instructions which may contain other information and recommendations beyond what is mentioned above in the assessment and plan.  Meds ordered this encounter  Medications  . Olmesartan-Amlodipine-HCTZ 40-10-25 MG TABS    Sig: Take 1 tablet by mouth daily.    Dispense:  30 tablet    Refill:  2  . metoprolol succinate (TOPROL-XL) 50 MG 24 hr tablet    Sig: Take 2 tablets (100 mg total) by mouth daily. Take with or immediately following a meal.    Dispense:  30 tablet    Refill:  2    Orders Placed This Encounter  Procedures  . Culture, Group A Strep  . TSH  . Lipid panel  . COMPLETE METABOLIC PANEL WITH GFR  . POCT rapid strep A

## 2016-01-31 NOTE — Assessment & Plan Note (Signed)
Avoid saturated fats; work on weight loss; continue statin

## 2016-01-31 NOTE — Assessment & Plan Note (Signed)
Try DASH guidelines; work on weight loss; start back on meds; check by here next week for free BP check with CMA

## 2016-01-31 NOTE — Patient Instructions (Signed)
Check out the information at familydoctor.org entitled "Nutrition for Weight Loss: What You Need to Know about Fad Diets" Try to lose between 1-2 pounds per week by taking in fewer calories and burning off more calories You can succeed by limiting portions, limiting foods dense in calories and fat, becoming more active, and drinking 8 glasses of water a day (64 ounces) Don't skip meals, especially breakfast, as skipping meals may alter your metabolism Do not use over-the-counter weight loss pills or gimmicks that claim rapid weight loss A healthy BMI (or body mass index) is between 18.5 and 24.9 You can calculate your ideal BMI at the NIH website JobEconomics.huhttp://www.nhlbi.nih.gov/health/educational/lose_wt/BMI/bmicalc.htm Your goal blood pressure is less than 140 mmHg on top and less than 90 mmHg on the bottom Try to follow the DASH guidelines (DASH stands for Dietary Approaches to Stop Hypertension) Try to limit the sodium in your diet.  Ideally, consume less than 1.5 grams (less than 1,500mg ) per day. Do not add salt when cooking or at the table.  Check the sodium amount on labels when shopping, and choose items lower in sodium when given a choice. Avoid or limit foods that already contain a lot of sodium. Eat a diet rich in fruits and vegetables and whole grains. We'll get labs today If you have not heard anything from my staff in a week about any orders/referrals/studies from today, please contact us here to follow-up [phone]

## 2016-01-31 NOTE — Assessment & Plan Note (Addendum)
Urged patient to work on weight loss, check TSH b/c of weight gain; see after visit summary for ideas and strategies to help with weight loss

## 2016-02-01 ENCOUNTER — Other Ambulatory Visit: Payer: Self-pay | Admitting: Family Medicine

## 2016-02-01 DIAGNOSIS — E782 Mixed hyperlipidemia: Secondary | ICD-10-CM

## 2016-02-01 MED ORDER — ATORVASTATIN CALCIUM 40 MG PO TABS: 40.0000 mg | ORAL_TABLET | Freq: Every day | ORAL | 6 refills | Status: DC

## 2016-02-01 NOTE — Progress Notes (Signed)
Rx for statin sent

## 2016-02-02 LAB — CULTURE, GROUP A STREP: Organism ID, Bacteria: NORMAL

## 2016-03-12 ENCOUNTER — Other Ambulatory Visit: Payer: Self-pay

## 2016-03-14 MED ORDER — FLUTICASONE PROPIONATE 50 MCG/ACT NA SUSP: 1.0000 | Freq: Two times a day (BID) | NASAL | 11 refills | Status: DC

## 2016-04-17 ENCOUNTER — Other Ambulatory Visit: Payer: Self-pay | Admitting: Family Medicine

## 2016-04-17 ENCOUNTER — Other Ambulatory Visit: Payer: Self-pay

## 2016-04-17 MED ORDER — FLUTICASONE PROPIONATE 50 MCG/ACT NA SUSP: 1.0000 | Freq: Two times a day (BID) | NASAL | 11 refills | Status: DC

## 2016-04-17 NOTE — Telephone Encounter (Signed)
rx sent

## 2016-04-17 NOTE — Addendum Note (Signed)
Addended by: LADA, MELINDA P on: 04/17/2016 05:00 PM   Modules accepted: Orders

## 2016-05-29 ENCOUNTER — Other Ambulatory Visit: Payer: Self-pay | Admitting: Family Medicine

## 2016-05-29 NOTE — Telephone Encounter (Signed)
Left detailed voicemail to call back and clarify

## 2016-05-29 NOTE — Telephone Encounter (Signed)
Please verify with patient if he is taking a single 50 mg toprol or two 50 mg toprol pills (100 mg) Thank you

## 2016-05-30 MED ORDER — OLMESARTAN-AMLODIPINE-HCTZ 40-10-25 MG PO TABS: 1.0000 | ORAL_TABLET | Freq: Every day | ORAL | 1 refills | Status: DC

## 2016-05-30 NOTE — Telephone Encounter (Signed)
Thank you I updated the med list Last Cr and K+ reviewed Rxs approved

## 2016-05-30 NOTE — Telephone Encounter (Signed)
Patient called back state only taking 1 50 mg tablet and also the olmesartan-amlodipine-HCTZ.  He needs these sent to [facility]

## 2016-06-09 DIAGNOSIS — L089 Local infection of the skin and subcutaneous tissue, unspecified: Secondary | ICD-10-CM | POA: Diagnosis not present

## 2016-06-09 DIAGNOSIS — L723 Sebaceous cyst: Secondary | ICD-10-CM | POA: Diagnosis not present

## 2016-08-01 ENCOUNTER — Ambulatory Visit: Payer: BLUE CROSS/BLUE SHIELD | Admitting: Family Medicine

## 2016-08-09 ENCOUNTER — Ambulatory Visit (INDEPENDENT_AMBULATORY_CARE_PROVIDER_SITE_OTHER): Payer: BLUE CROSS/BLUE SHIELD | Admitting: Family Medicine

## 2016-08-09 ENCOUNTER — Encounter: Payer: Self-pay | Admitting: Family Medicine

## 2016-08-09 DIAGNOSIS — J302 Other seasonal allergic rhinitis: Secondary | ICD-10-CM | POA: Diagnosis not present

## 2016-08-09 DIAGNOSIS — I10 Essential (primary) hypertension: Secondary | ICD-10-CM | POA: Diagnosis not present

## 2016-08-09 DIAGNOSIS — Z5181 Encounter for therapeutic drug level monitoring: Secondary | ICD-10-CM

## 2016-08-09 DIAGNOSIS — E782 Mixed hyperlipidemia: Secondary | ICD-10-CM

## 2016-08-09 DIAGNOSIS — E66813 Obesity, class 3: Secondary | ICD-10-CM

## 2016-08-09 MED ORDER — ATORVASTATIN CALCIUM 40 MG PO TABS: 40.0000 mg | ORAL_TABLET | Freq: Every day | ORAL | 3 refills | Status: DC

## 2016-08-09 NOTE — Assessment & Plan Note (Signed)
Avoid decongestants 

## 2016-08-09 NOTE — Progress Notes (Signed)
BP (!) 142/92   Pulse 98   Temp 98 F (36.7 C) (Oral)   Resp 14   Wt (!) 355 lb 4.8 oz (161.2 kg)   SpO2 98%   BMI 44.41 kg/m    Subjective:    Patient ID: John Lowe, male    DOB: [birth year], 36 y.o.   MRN: [id number]  HPI: John Lowe is a 36 y.o. male  Chief Complaint  Patient presents with  . Follow-up    HPI Hypertension; improved relative to last visit Back on medicine; no missed doses BP Readings from Last 3 Encounters:  08/09/16 (!) 142/92  01/31/16 (!) 160/102  09/27/15 138/90  not much salt; not much processed food He is eating better, losing weight He did use a decongestant though for allergies  Obesity Oatmeal, only baked food at lunch, really changed up his diet [facility] [place] [facility] [place]; no chest pain; no trouble breathing  High cholesterol Reviewed last few lipid panels Brought his total and LDL each down more than 100 points! Taking his medicine; no problems; eating better, losing weight, working out Mother has high cholesterol, father   Renal insuffiency 12 months ago back down to normal on last check  Allergies; did use decongestant  Depression screen [facility] 2/9 08/09/2016 09/27/2015 08/31/2014  Decreased Interest 0 0 0  Down, Depressed, Hopeless 0 0 0  PHQ - 2 Score 0 0 0   Relevant past medical, surgical, family and social history reviewed Past Medical History:  Diagnosis Date  . Hyperlipidemia   . Hypertension   . Obesity, Class III, BMI 40-49.9 (morbid obesity) (HCC)    Past Surgical History:  Procedure Laterality Date  . ANKLE SURGERY    . rotator cuff Right    Family History  Problem Relation Age of Onset  . Hypertension Mother   . Hypertension Father   . Heart disease Father   . Kidney disease Father   . Hypertension Sister   . Hypertension Brother   . Cancer Maternal Grandmother        breast  . Cancer Maternal Grandfather        bone cancer  . Hypertension Paternal Grandmother   MD note:  father died in 12-22-15; bad batch of drugs, mixed with fentanyl and rat poison  Social History   Social History  . Marital status: Single    Spouse name: N/A  . Number of children: N/A  . Years of education: N/A   Occupational History  . Not on file.   Social History Main Topics  . Smoking status: Never Smoker  . Smokeless tobacco: Never Used  . Alcohol use No  . Drug use: Unknown  . Sexual activity: No   Other Topics Concern  . Not on file   Social History Narrative  . No narrative on file   Interim medical history since last visit reviewed. Allergies and medications reviewed  Review of Systems Per HPI unless specifically indicated above     Objective:    BP (!) 142/92   Pulse 98   Temp 98 F (36.7 C) (Oral)   Resp 14   Wt (!) 355 lb 4.8 oz (161.2 kg)   SpO2 98%   BMI 44.41 kg/m   Wt Readings from Last 3 Encounters:  08/09/16 (!) 355 lb 4.8 oz (161.2 kg)  01/31/16 (!) 379 lb (171.9 kg)  09/27/15 (!) 362 lb (164.2 kg)    Physical Exam  Constitutional: He  appears well-developed and well-nourished. No distress.  Morbid obesity; weight loss nearly 24 pounds over last 6 months  HENT:  Head: Normocephalic and atraumatic.  Nose: No rhinorrhea.  Mouth/Throat: Oropharynx is clear and moist and mucous membranes are normal.  Eyes: EOM are normal. No scleral icterus.  Neck: No thyromegaly present.  Cardiovascular: Normal rate and regular rhythm.   Pulmonary/Chest: Effort normal and breath sounds normal.  Abdominal: Soft. Bowel sounds are normal. He exhibits no distension.  Musculoskeletal: He exhibits no edema.  Lymphadenopathy:    He has no cervical adenopathy.  Neurological: He is alert.  Skin: Skin is warm and dry. No pallor.  Psychiatric: He has a normal mood and affect. His behavior is normal. Judgment and thought content normal.   Results for orders placed or performed in visit on 01/31/16  Culture, Group A Strep  Result Value Ref Range    Organism ID, Bacteria      Normal Upper Respiratory Flora No Beta Hemolytic Streptococci Isolated   TSH  Result Value Ref Range   TSH 1.24 0.40 - 4.50 mIU/L  Lipid panel  Result Value Ref Range   Cholesterol 125 <200 mg/dL   Triglycerides 75 <147<150 mg/dL   HDL 44 >82>40 mg/dL   Total CHOL/HDL Ratio 2.8 <5.0 Ratio   VLDL 15 <30 mg/dL   LDL Cholesterol 66 <956<100 mg/dL  COMPLETE METABOLIC PANEL WITH GFR  Result Value Ref Range   Sodium 138 135 - 146 mmol/L   Potassium 4.1 3.5 - 5.3 mmol/L   Chloride 102 98 - 110 mmol/L   CO2 28 20 - 31 mmol/L   Glucose, Bld 105 (H) 65 - 99 mg/dL   BUN 11 7 - 25 mg/dL   Creat 2.131.09 0.860.60 - 5.781.35 mg/dL   Total Bilirubin 0.4 0.2 - 1.2 mg/dL   Alkaline Phosphatase 110 40 - 115 U/L   AST 25 10 - 40 U/L   ALT 41 9 - 46 U/L   Total Protein 6.8 6.1 - 8.1 g/dL   Albumin 3.9 3.6 - 5.1 g/dL   Calcium 9.3 8.6 - 46.910.3 mg/dL   GFR, Est African American >89 >=60 mL/min   GFR, Est Non African American 86 >=60 mL/min  POCT rapid strep A  Result Value Ref Range   Rapid Strep A Screen Negative Negative      Assessment & Plan:   Problem List Items Addressed This Visit      Cardiovascular and Mediastinum   Hypertension (Chronic)    Continue meds; continue weight loss, avoid salt; avoid decongestants; recheck when he returns for labs      Relevant Medications   atorvastatin (LIPITOR) 40 MG tablet     Respiratory   Allergic rhinitis (Chronic)    Avoid decongestants        Other   Obesity, Class III, BMI 40-49.9 (morbid obesity) (HCC) (Chronic)    Praise given; really great results; continue healthy eating and activity      Medication monitoring encounter    Monitor Cr, K+, SGPT on current medicine      Relevant Orders   COMPLETE METABOLIC PANEL WITH GFR   Hyperlipidemia (Chronic)    Continue the great job at weight loss and healthy diet and activity; continue statin; recheck; hopefully in the future, we can reduce or even stop this      Relevant  Medications   atorvastatin (LIPITOR) 40 MG tablet   Other Relevant Orders   Lipid panel  Follow up plan: Return in about 7 months (around 03/11/2017).  An after-visit summary was printed and given to the patient at check-out.  Please see the patient instructions which may contain other information and recommendations beyond what is mentioned above in the assessment and plan.  Meds ordered this encounter  Medications  . atorvastatin (LIPITOR) 40 MG tablet    Sig: Take 1 tablet (40 mg total) by mouth at bedtime.    Dispense:  90 tablet    Refill:  3    Orders Placed This Encounter  Procedures  . COMPLETE METABOLIC PANEL WITH GFR  . Lipid panel

## 2016-08-09 NOTE — Assessment & Plan Note (Signed)
Monitor Cr, K+, SGPT on current medicine

## 2016-08-09 NOTE — Assessment & Plan Note (Signed)
Praise given; really great results; continue healthy eating and activity

## 2016-08-09 NOTE — Patient Instructions (Addendum)
Try to use PLAIN allergy medicine without the decongestant Avoid: phenylephrine, phenylpropanolamine, and pseudoephredine Try to limit saturated fats in your diet (bologna, hot dogs, barbeque, cheeseburgers, hamburgers, steak, bacon, sausage, cheese, etc.) and get more fresh fruits, vegetables, and whole grains Keep up the great job with your healthy lifestyle Return in the next few weeks for labs

## 2016-08-09 NOTE — Assessment & Plan Note (Addendum)
Continue meds; continue weight loss, avoid salt; avoid decongestants; recheck when he returns for labs

## 2016-08-09 NOTE — Assessment & Plan Note (Signed)
Continue the great job at weight loss and healthy diet and activity; continue statin; recheck; hopefully in the future, we can reduce or even stop this

## 2016-10-25 ENCOUNTER — Telehealth: Payer: Self-pay | Admitting: Family Medicine

## 2016-10-25 NOTE — Telephone Encounter (Signed)
Please remind patient to get the labs drawn that were done in June; thank you (fasting)

## 2016-10-25 NOTE — Telephone Encounter (Signed)
Pt will come in when he get back from his trip.

## 2017-03-12 ENCOUNTER — Ambulatory Visit: Payer: BLUE CROSS/BLUE SHIELD | Admitting: Family Medicine

## 2017-04-01 ENCOUNTER — Ambulatory Visit (INDEPENDENT_AMBULATORY_CARE_PROVIDER_SITE_OTHER): Payer: BLUE CROSS/BLUE SHIELD | Admitting: Family Medicine

## 2017-04-01 ENCOUNTER — Encounter: Payer: Self-pay | Admitting: Family Medicine

## 2017-04-01 DIAGNOSIS — E782 Mixed hyperlipidemia: Secondary | ICD-10-CM

## 2017-04-01 DIAGNOSIS — Z5181 Encounter for therapeutic drug level monitoring: Secondary | ICD-10-CM

## 2017-04-01 DIAGNOSIS — I10 Essential (primary) hypertension: Secondary | ICD-10-CM

## 2017-04-01 DIAGNOSIS — R7309 Other abnormal glucose: Secondary | ICD-10-CM | POA: Insufficient documentation

## 2017-04-01 MED ORDER — METOPROLOL SUCCINATE ER 50 MG PO TB24: 50.0000 mg | ORAL_TABLET | Freq: Every day | ORAL | 1 refills | Status: DC

## 2017-04-01 MED ORDER — FLUTICASONE PROPIONATE 50 MCG/ACT NA SUSP: 1.0000 | Freq: Two times a day (BID) | NASAL | 11 refills | Status: DC

## 2017-04-01 MED ORDER — OLMESARTAN-AMLODIPINE-HCTZ 40-10-25 MG PO TABS: 1.0000 | ORAL_TABLET | Freq: Every day | ORAL | 1 refills | Status: DC

## 2017-04-01 NOTE — Assessment & Plan Note (Signed)
Check lipids today 

## 2017-04-01 NOTE — Patient Instructions (Addendum)
Try to use PLAIN allergy medicine without the decongestant Avoid: phenylephrine, phenylpropanolamine, and pseudoephredine  Try to follow the DASH guidelines (DASH stands for Dietary Approaches to Stop Hypertension). Try to limit the sodium in your diet to no more than 1,500mg of sodium per day. Certainly try to not exceed 2,000 mg per day at the very most. Do not add salt when cooking or at the table.  Check the sodium amount on labels when shopping, and choose items lower in sodium when given a choice. Avoid or limit foods that already contain a lot of sodium. Eat a diet rich in fruits and vegetables and whole grains, and try to lose weight if overweight or obese  Check out the information at familydoctor.org entitled "Nutrition for Weight Loss: What You Need to Know about Fad Diets" Try to lose between 1-2 pounds per week by taking in fewer calories and burning off more calories You can succeed by limiting portions, limiting foods dense in calories and fat, becoming more active, and drinking 8 glasses of water a day (64 ounces) Don't skip meals, especially breakfast, as skipping meals may alter your metabolism Do not use over-the-counter weight loss pills or gimmicks that claim rapid weight loss A healthy BMI (or body mass index) is between 18.5 and 24.9 You can calculate your ideal BMI at the NIH website http://www.nhlbi.nih.gov/health/educational/lose_wt/BMI/bmicalc.htm  

## 2017-04-01 NOTE — Assessment & Plan Note (Signed)
Avoid sugary drinks; check A1c 

## 2017-04-01 NOTE — Progress Notes (Signed)
BP (!) 172/104 (BP Location: Right Arm, Cuff Size: Large)   Pulse 68   Temp 98.3 F (36.8 C) (Oral)   Resp 14   Wt (!) 363 lb 1.6 oz (164.7 kg)   SpO2 98%   BMI 45.38 kg/m    Subjective:    Patient ID: John Lowe, male    DOB: 09/24/1980, 37 y.o.   MRN: [id number]  HPI: John FlemingsJermaine Crowl is a 37 y.o. male  Chief Complaint  Patient presents with  . Follow-up  . Medication Refill    HPI Patient is here for f/u HTN; runs in the family on both sides Ran out of medicine, no problems with the medicine Never low Checks away from here, 142 or so systolic Misses some doses No chest pain, no SHOB, no headache  Tries to avoid salt Was on decongestants  High cholesterol; last LDL dropped 102 points; mother's side has high cholesterol; uncles has heart failure, kidneys shut down  Glucose was a little elevated last check; mother has diabetes; no blurred vision or dry mouth  Ob[facility]; drinks enough water; limit portions; not fast food  [facility] 2[facility]9 04/01/2017 08/09/2016 09/27/2015 08/31/2014  Decreased Interest 0 0 0 0  Down, Depressed, Hopeless 0 0 0 0  PHQ - 2 Score 0 0 0 0   Relevant past medical, surgical, family and social history reviewed Past Medical History:  Diagnosis Date  . Hyperlipidemia   . Hypertension   . Obesity, Class III, BMI 40-49.9 (morbid obesity) (HCC)    Past Surgical History:  Procedure Laterality Date  . ANKLE SURGERY    . rotator cuff Right    Family History  Problem Relation Age of Onset  . Hypertension Mother   . Hypertension Father   . Heart disease Father   . Kidney disease Father   . Hypertension Sister   . Hypertension Brother   . Cancer Maternal Grandmother        breast  . Cancer Maternal Grandfather        bone cancer  . Hypertension Paternal Grandmother    Social History   Tobacco Use  . Smoking status: Never Smoker  . Smokeless tobacco: Never Used  Substance Use Topics  . Alcohol use: No  .  Drug use: No    Interim medical history since last visit reviewed. Allergies and medications reviewed  Review of Systems Per HPI unless specifically indicated above     Objective:    BP (!) 172/104 (BP Location: Right Arm, Cuff Size: Large)   Pulse 68   Temp 98.3 F (36.8 C) (Oral)   Resp 14   Wt (!) 363 lb 1.6 oz (164.7 kg)   SpO2 98%   BMI 45.38 kg/m   Wt Readings from Last 3 Encounters:  04/01/17 (!) 363 lb 1.6 oz (164.7 kg)  08/09/16 (!) 355 lb 4.8 oz (161.2 kg)  01/31/16 (!) 379 lb (171.9 kg)    Physical Exam  Constitutional: He appears well-developed and well-nourished. No distress.  Morbid obesity; weight gain 8 pounds over last 8 months  HENT:  Head: Normocephalic and atraumatic.  Nose: No rhinorrhea.  Mouth/Throat: Oropharynx is clear and moist and mucous membranes are normal.  Eyes: EOM are normal. No scleral icterus.  Neck: No thyromegaly present.  Cardiovascular: Normal rate and regular rhythm.  Pulmonary/Chest: Effort normal and breath sounds normal.  Abdominal: Soft. Bowel sounds are normal. He exhibits no distension.  Musculoskeletal: He exhibits no edema.  Lymphadenopathy:    He has no cervical adenopathy.  Neurological: He is alert.  Skin: Skin is warm and dry. No pallor.  Psychiatric: He has a normal mood and affect. His behavior is normal. Judgment and thought content normal.   Results for orders placed or performed in visit on 01/31/16  Culture, Group A Strep  Result Value Ref Range   Organism ID, Bacteria      Normal Upper Respiratory Flora No Beta Hemolytic Streptococci Isolated   TSH  Result Value Ref Range   TSH 1.24 0.40 - 4.50 mIU/L  Lipid panel  Result Value Ref Range   Cholesterol 125 <200 mg/dL   Triglycerides 75 <960 mg/dL   HDL 44 >45 mg/dL   Total CHOL/HDL Ratio 2.8 <5.0 Ratio   VLDL 15 <30 mg/dL   LDL Cholesterol 66 <409 mg/dL  COMPLETE METABOLIC PANEL WITH GFR  Result Value Ref Range   Sodium 138 135 - 146 mmol/L    Potassium 4.1 3.5 - 5.3 mmol/L   Chloride 102 98 - 110 mmol/L   CO2 28 20 - 31 mmol/L   Glucose, Bld 105 (H) 65 - 99 mg/dL   BUN 11 7 - 25 mg/dL   Creat 8.11 9.14 - 7.82 mg/dL   Total Bilirubin 0.4 0.2 - 1.2 mg/dL   Alkaline Phosphatase 110 40 - 115 U/L   AST 25 10 - 40 U/L   ALT 41 9 - 46 U/L   Total Protein 6.8 6.1 - 8.1 g/dL   Albumin 3.9 3.6 - 5.1 g/dL   Calcium 9.3 8.6 - 95.6 mg/dL   GFR, Est African American >89 >=60 mL/min   GFR, Est Non African American 86 >=60 mL/min  POCT rapid strep A  Result Value Ref Range   Rapid Strep A Screen Negative Negative      Assessment & Plan:   Problem List Items Addressed This Visit      Cardiovascular and Mediastinum   Hypertension (Chronic)    Start back on medicines; return in 10-12 days and recheck; avoid decongestants, salt; try the DASH guidelines      Relevant Medications   metoprolol succinate (TOPROL-XL) 50 MG 24 hr tablet   Olmesartan-Amlodipine-HCTZ 40-10-25 MG TABS     Other   Obesity, Class III, BMI 40-49.9 (morbid obesity) (HCC) (Chronic)    Working on it; likely genetic predisposition; weight loss will help delay diabetes, help HTN      Medication monitoring encounter    Check liver and kidneys      Relevant Orders   COMPLETE METABOLIC PANEL WITH GFR   Hyperlipidemia (Chronic)    Check lipids today      Relevant Medications   metoprolol succinate (TOPROL-XL) 50 MG 24 hr tablet   Olmesartan-Amlodipine-HCTZ 40-10-25 MG TABS   Other Relevant Orders   Lipid panel   Elevated glucose    Avoid sugary drinks; check A1c      Relevant Orders   Hemoglobin A1c       Follow up plan: Return in about 10 days (around 04/11/2017) for blood pressure recheck with CMA.  An after-visit summary was printed and given to the patient at check-out.  Please see the patient instructions which may contain other information and recommendations beyond what is mentioned above in the assessment and plan.  Meds ordered this  encounter  Medications  . metoprolol succinate (TOPROL-XL) 50 MG 24 hr tablet    Sig: Take 1 tablet (50 mg total) by mouth daily.  Dispense:  90 tablet    Refill:  1    Please consider 90 day supplies to promote better adherence  . Olmesartan-Amlodipine-HCTZ 40-10-25 MG TABS    Sig: Take 1 tablet by mouth daily.    Dispense:  90 tablet    Refill:  1  . fluticasone (FLONASE) 50 MCG/ACT nasal spray    Sig: Place 1 spray into both nostrils 2 (two) times daily.    Dispense:  16 g    Refill:  11    Orders Placed This Encounter  Procedures  . COMPLETE METABOLIC PANEL WITH GFR  . Hemoglobin A1c  . Lipid panel

## 2017-04-01 NOTE — Assessment & Plan Note (Signed)
Start back on medicines; return in 10-12 days and recheck; avoid decongestants, salt; try the DASH guidelines

## 2017-04-01 NOTE — Assessment & Plan Note (Signed)
Working on it; likely genetic predisposition; weight loss will help delay diabetes, help HTN

## 2017-04-01 NOTE — Assessment & Plan Note (Signed)
Check liver and kidneys 

## 2017-04-02 LAB — LIPID PANEL
CHOL/HDL RATIO: 4.1 (calc) (ref <5.0)
Cholesterol: 194 mg/dL (ref <200)
HDL: 47 mg/dL (ref >40)
LDL Cholesterol (Calc): 130 mg/dL (calc) — ABNORMAL HIGH
Non-HDL Cholesterol (Calc): 147 mg/dL (calc) — ABNORMAL HIGH (ref <130)
Triglycerides: 74 mg/dL (ref <150)

## 2017-04-02 LAB — COMPLETE METABOLIC PANEL WITH GFR
AG RATIO: 1.4 (calc) (ref 1.0–2.5)
ALBUMIN MSPROF: 4.2 g/dL (ref 3.6–5.1)
ALT: 18 U/L (ref 9–46)
AST: 18 U/L (ref 10–40)
Alkaline phosphatase (APISO): 60 U/L (ref 40–115)
BUN: 11 mg/dL (ref 7–25)
CO2: 30 mmol/L (ref 20–32)
Calcium: 9.9 mg/dL (ref 8.6–10.3)
Chloride: 103 mmol/L (ref 98–110)
Creat: 1.07 mg/dL (ref 0.60–1.35)
GFR, EST NON AFRICAN AMERICAN: 88 mL/min/{1.73_m2} (ref >=60)
GFR, Est African American: 102 mL/min/{1.73_m2} (ref >=60)
GLOBULIN: 3 g/dL (ref 1.9–3.7)
Glucose, Bld: 86 mg/dL (ref 65–139)
POTASSIUM: 3.9 mmol/L (ref 3.5–5.3)
SODIUM: 140 mmol/L (ref 135–146)
Total Bilirubin: 0.5 mg/dL (ref 0.2–1.2)
Total Protein: 7.2 g/dL (ref 6.1–8.1)

## 2017-04-02 LAB — HEMOGLOBIN A1C
EAG (MMOL/L): 5.8 (calc)
HEMOGLOBIN A1C: 5.3 %{Hb} (ref <5.7)
MEAN PLASMA GLUCOSE: 105 (calc)

## 2017-04-12 ENCOUNTER — Ambulatory Visit: Payer: BLUE CROSS/BLUE SHIELD

## 2017-04-24 ENCOUNTER — Ambulatory Visit: Payer: BLUE CROSS/BLUE SHIELD

## 2017-04-24 ENCOUNTER — Telehealth: Payer: Self-pay

## 2017-04-24 VITALS — BP 142/90

## 2017-04-24 DIAGNOSIS — I10 Essential (primary) hypertension: Secondary | ICD-10-CM

## 2017-04-24 NOTE — Telephone Encounter (Signed)
Pt came in for bp check today.  Bp was 142/90.  I notified pt that Dr. Was not in and I would consult with her and call him back tomorrow with plan.

## 2017-04-25 NOTE — Telephone Encounter (Signed)
Pt notified and appointment scheduled.

## 2017-04-25 NOTE — Telephone Encounter (Signed)
Please let pt know that his BP is much improved However, it's not to goal Let's have him go one more week and then return on strict DASH guidelines Recheck BP and pulse and weight in one week and then we'll see if we need to increase the toprol Thank you

## 2017-05-06 ENCOUNTER — Ambulatory Visit: Payer: Self-pay

## 2018-02-12 ENCOUNTER — Telehealth: Payer: Self-pay | Admitting: Family Medicine

## 2018-02-12 ENCOUNTER — Telehealth: Payer: Self-pay

## 2018-02-12 MED ORDER — OLMESARTAN-AMLODIPINE-HCTZ 40-10-25 MG PO TABS: 1.0000 | ORAL_TABLET | Freq: Every day | ORAL | 0 refills | Status: DC

## 2018-02-12 MED ORDER — METOPROLOL SUCCINATE ER 50 MG PO TB24: 50.0000 mg | ORAL_TABLET | Freq: Every day | ORAL | 0 refills | Status: DC

## 2018-02-12 NOTE — Telephone Encounter (Signed)
Pt verbally informed prescriptions have been sent to pharmacy. Pt also scheduled appt for January

## 2018-02-12 NOTE — Telephone Encounter (Signed)
Copied from CRM [id number]. Topic: Quick Communication - Rx Refill/Question >> Feb 12, 2018  9:49 AM Arlyss Gandyichardson, Shamere Dilworth N, NT wrote: Medication: Olmesartan-Amlodipine-HCTZ 40-10-25 MG TABS and metoprolol succinate (TOPROL-XL) 50 MG 24 hr tablet   Has the patient contacted their pharmacy? Yes.   (Agent: If no, request that the patient contact the pharmacy for the refill.) (Agent: If yes, when and what did the pharmacy advise?)  Preferred Pharmacy (with phone number or street name): [facility] [address] - [place], [place] - [id number] [phone] (Phone) [phone] (Fax)    Agent: Please be advised that RX refills may take up to 3 business days. We ask that you follow-up with your pharmacy.

## 2018-02-12 NOTE — Telephone Encounter (Signed)
Patient needs an appt ASAP with me please He no showed for his BP check in March I suspect non-compliance too since the 6 month supply of medicine prescribed in Feb should have run out in August I'll prescribe meds this one time (30 day supply) and we look forward to seeing him soon and helping him with his pressure and other health problems

## 2018-03-10 ENCOUNTER — Ambulatory Visit (INDEPENDENT_AMBULATORY_CARE_PROVIDER_SITE_OTHER): Payer: BLUE CROSS/BLUE SHIELD | Admitting: Family Medicine

## 2018-03-10 ENCOUNTER — Encounter: Payer: Self-pay | Admitting: Family Medicine

## 2018-03-10 VITALS — BP 128/84 | HR 79 | Temp 98.5°F | Ht 75.0 in | Wt 357.9 lb

## 2018-03-10 DIAGNOSIS — R7309 Other abnormal glucose: Secondary | ICD-10-CM

## 2018-03-10 DIAGNOSIS — I10 Essential (primary) hypertension: Secondary | ICD-10-CM

## 2018-03-10 DIAGNOSIS — Z5181 Encounter for therapeutic drug level monitoring: Secondary | ICD-10-CM

## 2018-03-10 DIAGNOSIS — E782 Mixed hyperlipidemia: Secondary | ICD-10-CM | POA: Diagnosis not present

## 2018-03-10 MED ORDER — METOPROLOL SUCCINATE ER 50 MG PO TB24: 50.0000 mg | ORAL_TABLET | Freq: Every day | ORAL | 0 refills | Status: DC

## 2018-03-10 MED ORDER — OLMESARTAN-AMLODIPINE-HCTZ 40-10-25 MG PO TABS: 1.0000 | ORAL_TABLET | Freq: Every day | ORAL | 0 refills | Status: DC

## 2018-03-10 NOTE — Patient Instructions (Addendum)
Check out the information at familydoctor.org entitled "Nutrition for Weight Loss: What You Need to Know about Fad Diets" Try to lose between 1-2 pounds per week by taking in fewer calories and burning off more calories You can succeed by limiting portions, limiting foods dense in calories and fat, becoming more active, and drinking 8 glasses of water a day (64 ounces) Don't skip meals, especially breakfast, as skipping meals may alter your metabolism Do not use over-the-counter weight loss pills or gimmicks that claim rapid weight loss A healthy BMI (or body mass index) is between 18.5 and 24.9 You can calculate your ideal BMI at the NIH website http://www.nhlbi.nih.gov/health/educational/lose_wt/BMI/bmicalc.htm  Obesity, Adult Obesity is the condition of having too much total body fat. Being overweight or obese means that your weight is greater than what is considered healthy for your body size. Obesity is determined by a measurement called BMI. BMI is an estimate of body fat and is calculated from height and weight. For adults, a BMI of 30 or higher is considered obese. Obesity can eventually lead to other health concerns and major illnesses, including:  Stroke.  Coronary artery disease (CAD).  Type 2 diabetes.  Some types of cancer, including cancers of the colon, breast, uterus, and gallbladder.  Osteoarthritis.  High blood pressure (hypertension).  High cholesterol.  Sleep apnea.  Gallbladder stones.  Infertility problems. What are the causes? The main cause of obesity is taking in (consuming) more calories than your body uses for energy. Other factors that contribute to this condition may include:  Being born with genes that make you more likely to become obese.  Having a medical condition that causes obesity. These conditions include: ? Hypothyroidism. ? Polycystic ovarian syndrome (PCOS). ? Binge-eating disorder. ? Cushing syndrome.  Taking certain medicines, such  as steroids, antidepressants, and seizure medicines.  Not being physically active (sedentary lifestyle).  Living where there are limited places to exercise safely or buy healthy foods.  Not getting enough sleep. What increases the risk? The following factors may increase your risk of this condition:  Having a family history of obesity.  Being a woman of African-American descent.  Being a man of Hispanic descent. What are the signs or symptoms? Having excessive body fat is the main symptom of this condition. How is this diagnosed? This condition may be diagnosed based on:  Your symptoms.  Your medical history.  A physical exam. Your health care provider may measure: ? Your BMI. If you are an adult with a BMI between 25 and less than 30, you are considered overweight. If you are an adult with a BMI of 30 or higher, you are considered obese. ? The distances around your hips and your waist (circumferences). These may be compared to each other to help diagnose your condition. ? Your skinfold thickness. Your health care provider may gently pinch a fold of your skin and measure it. How is this treated? Treatment for this condition often includes changing your lifestyle. Treatment may include some or all of the following:  Dietary changes. Work with your health care provider and a dietitian to set a weight-loss goal that is healthy and reasonable for you. Dietary changes may include eating: ? Smaller portions. A portion size is the amount of a particular food that is healthy for you to eat at one time. This varies from person to person. ? Low-calorie or low-fat options. ? More whole grains, fruits, and vegetables.  Regular physical activity. This may include aerobic activity (cardio) and   strength training.  Medicine to help you lose weight. Your health care provider may prescribe medicine if you are unable to lose 1 pound a week after 6 weeks of eating more healthily and doing more  physical activity.  Surgery. Surgical options may include gastric banding and gastric bypass. Surgery may be done if: ? Other treatments have not helped to improve your condition. ? You have a BMI of 40 or higher. ? You have life-threatening health problems related to obesity. Follow these instructions at home:  Eating and drinking   Follow recommendations from your health care provider about what you eat and drink. Your health care provider may advise you to: ? Limit fast foods, sweets, and processed snack foods. ? Choose low-fat options, such as low-fat milk instead of whole milk. ? Eat 5 or more servings of fruits or vegetables every day. ? Eat at home more often. This gives you more control over what you eat. ? Choose healthy foods when you eat out. ? Learn what a healthy portion size is. ? Keep low-fat snacks on hand. ? Avoid sugary drinks, such as soda, fruit juice, iced tea sweetened with sugar, and flavored milk. ? Eat a healthy breakfast.  Drink enough water to keep your urine clear or pale yellow.  Do not go without eating for long periods of time (do not fast) or follow a fad diet. Fasting and fad diets can be unhealthy and even dangerous. Physical Activity  Exercise regularly, as told by your health care provider. Ask your health care provider what types of exercise are safe for you and how often you should exercise.  Warm up and stretch before being active.  Cool down and stretch after being active.  Rest between periods of activity. Lifestyle  Limit the time that you spend in front of your TV, computer, or video game system.  Find ways to reward yourself that do not involve food.  Limit alcohol intake to no more than 1 drink a day for nonpregnant women and 2 drinks a day for men. One drink equals 12 oz of beer, 5 oz of wine, or 1 oz of hard liquor. General instructions  Keep a weight loss journal to keep track of the food you eat and how much you exercise you  get.  Take over-the-counter and prescription medicines only as told by your health care provider.  Take vitamins and supplements only as told by your health care provider.  Consider joining a support group. Your health care provider may be able to recommend a support group.  Keep all follow-up visits as told by your health care provider. This is important. Contact a health care provider if:  You are unable to meet your weight loss goal after 6 weeks of dietary and lifestyle changes. This information is not intended to replace advice given to you by your health care provider. Make sure you discuss any questions you have with your health care provider. Document Released: 03/22/2004 Document Revised: 07/18/2015 Document Reviewed: 12/01/2014 Elsevier Interactive Patient Education  2019 Elsevier Inc.  Preventing Unhealthy Weight Gain, Adult Staying at a healthy weight is important to your overall health. When fat builds up in your body, you may become overweight or obese. Being overweight or obese increases your risk of developing certain health problems, such as heart disease, diabetes, sleeping problems, joint problems, and some types of cancer. Unhealthy weight gain is often the result of making unhealthy food choices or not getting enough exercise. You can make changes   to your lifestyle to prevent obesity and stay as healthy as possible. What nutrition changes can be made?   Eat only as much as your body needs. To do this: ? Pay attention to signs that you are hungry or full. Stop eating as soon as you feel full. ? If you feel hungry, try drinking water first before eating. Drink enough water so your urine is clear or pale yellow. ? Eat smaller portions. Pay attention to portion sizes when eating out. ? Look at serving sizes on food labels. Most foods contain more than one serving per container. ? Eat the recommended number of calories for your gender and activity level. For most active  people, a daily total of 2,000 calories is appropriate. If you are trying to lose weight or are not very active, you may need to eat fewer calories. Talk with your health care provider or a diet and nutrition specialist (dietitian) about how many calories you need each day.  Choose healthy foods, such as: ? Fruits and vegetables. At each meal, try to fill at least half of your plate with fruits and vegetables. ? Whole grains, such as whole-wheat bread, brown rice, and quinoa. ? Lean meats, such as chicken or fish. ? Other healthy proteins, such as beans, eggs, or tofu. ? Healthy fats, such as nuts, seeds, fatty fish, and olive oil. ? Low-fat or fat-free dairy products.  Check food labels, and avoid food and drinks that: ? Are high in calories. ? Have added sugar. ? Are high in sodium. ? Have saturated fats or trans fats.  Cook foods in healthier ways, such as by baking, broiling, or grilling.  Make a meal plan for the week, and shop with a grocery list to help you stay on track with your purchases. Try to avoid going to the grocery store when you are hungry.  When grocery shopping, try to shop around the outside of the store first, where the fresh foods are. Doing this helps you to avoid prepackaged foods, which can be high in sugar, salt (sodium), and fat. What lifestyle changes can be made?   Exercise for 30 or more minutes on 5 or more days each week. Exercising may include brisk walking, yard work, biking, running, swimming, and team sports like basketball and soccer. Ask your health care provider which exercises are safe for you.  Do muscle-strengthening activities, such as lifting weights or using resistance bands, on 2 or more days a week.  Do not use any products that contain nicotine or tobacco, such as cigarettes and e-cigarettes. If you need help quitting, ask your health care provider.  Limit alcohol intake to no more than 1 drink a day for nonpregnant women and 2 drinks a  day for men. One drink equals 12 oz of beer, 5 oz of wine, or 1 oz of hard liquor.  Try to get 7-9 hours of sleep each night. What other changes can be made?  Keep a food and activity journal to keep track of: ? What you ate and how many calories you had. Remember to count the calories in sauces, dressings, and side dishes. ? Whether you were active, and what exercises you did. ? Your calorie, weight, and activity goals.  Check your weight regularly. Track any changes. If you notice you have gained weight, make changes to your diet or activity routine.  Avoid taking weight-loss medicines or supplements. Talk to your health care provider before starting any new medicine or supplement.  Talk   to your health care provider before trying any new diet or exercise plan. Why are these changes important? Eating healthy, staying active, and having healthy habits can help you to prevent obesity. Those changes also:  Help you manage stress and emotions.  Help you connect with friends and family.  Improve your self-esteem.  Improve your sleep.  Prevent long-term health problems. What can happen if changes are not made? Being obese or overweight can cause you to develop joint or bone problems, which can make it hard for you to stay active or do activities you enjoy. Being obese or overweight also puts stress on your heart and lungs and can lead to health problems like diabetes, heart disease, and some cancers. Where to find more information Talk with your health care provider or a dietitian about healthy eating and healthy lifestyle choices. You may also find information from:  U.S. Department of Agriculture, MyPlate: www.choosemyplate.gov  American Heart Association: www.heart.org  Centers for Disease Control and Prevention: www.cdc.gov Summary  Staying at a healthy weight is important to your overall health. It helps you to prevent certain diseases and health problems, such as heart  disease, diabetes, joint problems, sleep disorders, and some types of cancer.  Being obese or overweight can cause you to develop joint or bone problems, which can make it hard for you to stay active or do activities you enjoy.  You can prevent unhealthy weight gain by eating a healthy diet, exercising regularly, not smoking, limiting alcohol, and getting enough sleep.  Talk with your health care provider or a dietitian for guidance about healthy eating and healthy lifestyle choices. This information is not intended to replace advice given to you by your health care provider. Make sure you discuss any questions you have with your health care provider. Document Released: 02/14/2016 Document Revised: 11/23/2016 Document Reviewed: 03/21/2016 Elsevier Interactive Patient Education  2019 Elsevier Inc.  

## 2018-03-10 NOTE — Progress Notes (Signed)
BP 128/84   Pulse 79   Temp 98.5 F (36.9 C) (Oral)   Ht 6\' 3"  (1.905 m)   Wt (!) 357 lb 14.4 oz (162.3 kg)   SpO2 97%   BMI 44.73 kg/m    Subjective:    Patient ID: John Lowe, male    DOB: [birth year], 38 y.o.   MRN: [id number]  HPI: John Lowe is a 38 y.o. male  Chief Complaint  Patient presents with  . Follow-up  . Medication Refill    HPI  Here for f/u  Blood pressure is controlled; checks away from the doctor, last check 135 on top without medicine Not using much salt; wife cooks with some salt; patient avoiding though; reading labels for sodium  High cholesterol; runs in the family; does eat eggs, not at work though; 1-2 x a week; he loves eggs and cheese; sometimes reading labels; he is not taking atovastatin; last prescribed in 2018; he had aching with the statin  Had belvita and banana for breakfast; nothing to eat in 6 hours  Morbid obesity; he has lost weight even over the holidays; I asked how much he would want to weight if he could wave a magic wand and wake up tomorrow a different weight; he would want to weigh 340 pounds; he almost weighed 400 pounds in high schoool; played football and working out; most of her uncles are sturdy and broad-shoulder; 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 he doesn't feel like cooking; he will try to bake instead of fried foods; he is trying to exercise more; [facility]   Depression screen [facility] 2/9 03/10/2018 04/01/2017 08/09/2016 09/27/2015 08/31/2014  Decreased Interest 0 0 0 0 0  Down, Depressed, Hopeless 0 0 0 0 0  PHQ - 2 Score 0 0 0 0 0  Altered sleeping 0 - - - -  Tired, decreased energy 0 - - - -  Change in appetite 0 - - - -  Feeling bad or failure about yourself  0 - - - -  Trouble concentrating 0 - - - -  Moving slowly or fidgety/restless 0 - - - -  Suicidal thoughts 0 - - - -  PHQ-9 Score 0 - - - -  Difficult doing work/chores Not difficult at all - - - -   Fall Risk  03/10/2018 04/01/2017 08/09/2016  09/27/2015 08/31/2014  Falls in the past year? 0 No No No No  Number falls in past yr: 0 - - - -  Injury with Fall? 0 - - - -    Relevant past medical, surgical, family and social history reviewed Past Medical History:  Diagnosis Date  . Hyperlipidemia   . Hypertension   . Obesity, Class III, BMI 40-49.9 (morbid obesity) (HCC)    Past Surgical History:  Procedure Laterality Date  . ANKLE SURGERY    . rotator cuff Right    Family History  Problem Relation Age of Onset  . Hypertension Mother   . Hypertension Father   . Heart disease Father   . Kidney disease Father   . Hypertension Sister   . Hypertension Brother   . Cancer Maternal Grandmother        breast  . Cancer Maternal Grandfather        bone cancer  . Hypertension Paternal Grandmother    Social History   Tobacco Use  . Smoking status: Never Smoker  . Smokeless tobacco: Never Used  Substance Use Topics  .  Alcohol use: No  . Drug use: No     Office Visit from 03/10/2018 in [facility]  AUDIT-C Score  0      Interim medical history since last visit reviewed. Allergies and medications reviewed  Review of Systems Per HPI unless specifically indicated above     Objective:    BP 128/84   Pulse 79   Temp 98.5 F (36.9 C) (Oral)   Ht 6\' 3"  (1.905 m)   Wt (!) 357 lb 14.4 oz (162.3 kg)   SpO2 97%   BMI 44.73 kg/m   Wt Readings from Last 3 Encounters:  03/10/18 (!) 357 lb 14.4 oz (162.3 kg)  04/01/17 (!) 363 lb 1.6 oz (164.7 kg)  08/09/16 (!) 355 lb 4.8 oz (161.2 kg)    Physical Exam Constitutional:      General: He is not in acute distress.    Appearance: He is well-developed. He is obese.  HENT:     Head: Normocephalic and atraumatic.  Eyes:     General: No scleral icterus. Neck:     Thyroid: No thyromegaly.  Cardiovascular:     Rate and Rhythm: Normal rate and regular rhythm.  Pulmonary:     Effort: Pulmonary effort is normal.     Breath sounds: Normal breath sounds.    Abdominal:     General: Bowel sounds are normal. There is no distension.     Palpations: Abdomen is soft.  Skin:    General: Skin is warm and dry.     Coloration: Skin is not pale.  Neurological:     Mental Status: He is alert.     Coordination: Coordination normal.  Psychiatric:        Mood and Affect: Mood is not anxious or depressed.        Behavior: Behavior normal.        Thought Content: Thought content normal.        Judgment: Judgment normal.       Assessment & Plan:   Problem List Items Addressed This Visit      Cardiovascular and Mediastinum   Hypertension - Primary (Chronic)    Fair control today; he's working on weight loss; DASH princples encouraged      Relevant Medications   metoprolol succinate (TOPROL-XL) 50 MG 24 hr tablet   Olmesartan-amLODIPine-HCTZ 40-10-25 MG TABS     Other   Medication monitoring encounter   Relevant Orders   COMPLETE METABOLIC PANEL WITH GFR (Completed)   Hyperlipidemia (Chronic)    Encouraged healthy eating; he is working on weight loss; check lipids; did not tolerate atorvastatin      Relevant Medications   metoprolol succinate (TOPROL-XL) 50 MG 24 hr tablet   Olmesartan-amLODIPine-HCTZ 40-10-25 MG TABS   Elevated glucose    Check glucose and A1c      Relevant Orders   Lipid panel (Completed)       Follow up plan: Return in about 6 months (around 09/08/2018) for follow-up visit with Dr. Sherie Don.  An after-visit summary was printed and given to the patient at check-out.  Please see the patient instructions which may contain other information and recommendations beyond what is mentioned above in the assessment and plan.  Meds ordered this encounter  Medications  . metoprolol succinate (TOPROL-XL) 50 MG 24 hr tablet    Sig: Take 1 tablet (50 mg total) by mouth daily.    Dispense:  30 tablet    Refill:  0  Please consider 90 day supplies to promote better adherence  . Olmesartan-amLODIPine-HCTZ 40-10-25 MG TABS     Sig: Take 1 tablet by mouth daily.    Dispense:  30 tablet    Refill:  0    Orders Placed This Encounter  Procedures  . COMPLETE METABOLIC PANEL WITH GFR  . Lipid panel

## 2018-03-11 LAB — COMPLETE METABOLIC PANEL WITH GFR
AG Ratio: 1.5 (calc) (ref 1.0–2.5)
ALBUMIN MSPROF: 4.4 g/dL (ref 3.6–5.1)
ALT: 31 U/L (ref 9–46)
AST: 23 U/L (ref 10–40)
Alkaline phosphatase (APISO): 70 U/L (ref 40–115)
BUN: 16 mg/dL (ref 7–25)
CO2: 28 mmol/L (ref 20–32)
CREATININE: 1.09 mg/dL (ref 0.60–1.35)
Calcium: 9.8 mg/dL (ref 8.6–10.3)
Chloride: 101 mmol/L (ref 98–110)
GFR, Est African American: 99 mL/min/{1.73_m2} (ref >=60)
GFR, Est Non African American: 85 mL/min/{1.73_m2} (ref >=60)
Globulin: 3 g/dL (calc) (ref 1.9–3.7)
Glucose, Bld: 84 mg/dL (ref 65–99)
Potassium: 3.9 mmol/L (ref 3.5–5.3)
Sodium: 138 mmol/L (ref 135–146)
Total Bilirubin: 0.6 mg/dL (ref 0.2–1.2)
Total Protein: 7.4 g/dL (ref 6.1–8.1)

## 2018-03-11 LAB — LIPID PANEL
CHOL/HDL RATIO: 5.5 (calc) — AB (ref <5.0)
CHOLESTEROL: 237 mg/dL — AB (ref <200)
HDL: 43 mg/dL (ref >40)
LDL Cholesterol (Calc): 176 mg/dL (calc) — ABNORMAL HIGH
Non-HDL Cholesterol (Calc): 194 mg/dL (calc) — ABNORMAL HIGH (ref <130)
Triglycerides: 77 mg/dL (ref <150)

## 2018-03-12 ENCOUNTER — Other Ambulatory Visit: Payer: Self-pay | Admitting: Family Medicine

## 2018-03-12 DIAGNOSIS — E782 Mixed hyperlipidemia: Secondary | ICD-10-CM

## 2018-03-12 MED ORDER — ATORVASTATIN CALCIUM 10 MG PO TABS: 10.0000 mg | ORAL_TABLET | Freq: Every day | ORAL | 1 refills | Status: DC

## 2018-03-12 NOTE — Progress Notes (Signed)
Recommended statin; recheck lipids in 6-8 weeks

## 2018-03-16 NOTE — Assessment & Plan Note (Signed)
Encouraged healthy eating; he is working on weight loss; check lipids; did not tolerate atorvastatin

## 2018-03-16 NOTE — Assessment & Plan Note (Signed)
Fair control today; he's working on weight loss; DASH princples encouraged

## 2018-03-16 NOTE — Assessment & Plan Note (Signed)
Check glucose and A1c 

## 2018-05-19 ENCOUNTER — Other Ambulatory Visit: Payer: Self-pay | Admitting: Family Medicine

## 2018-07-02 ENCOUNTER — Other Ambulatory Visit: Payer: Self-pay | Admitting: Nurse Practitioner

## 2018-07-03 ENCOUNTER — Other Ambulatory Visit: Payer: Self-pay | Admitting: Family Medicine

## 2018-07-03 MED ORDER — OLMESARTAN-AMLODIPINE-HCTZ 40-10-25 MG PO TABS: 1.0000 | ORAL_TABLET | Freq: Every day | ORAL | 0 refills | Status: DC

## 2018-07-03 NOTE — Telephone Encounter (Signed)
Copied from CRM [id number]. Topic: Quick Communication - Rx Refill/Question >> Jul 03, 2018  9:38 AM Reggie Pile, NT wrote: Medication:  Olmesartan-amLODIPine-HCTZ 40-10-25 MG TABS  Has the patient contacted their pharmacy? Yes. Patient is out   Preferred Pharmacy (with phone number or street name):  [facility] [address], [place] - [address] [phone] (Phone) [phone] (Fax)  Agent: Please be advised that RX refills may take up to 3 business days. We ask that you follow-up with your pharmacy.

## 2018-09-08 ENCOUNTER — Ambulatory Visit: Payer: BLUE CROSS/BLUE SHIELD | Admitting: Family Medicine

## 2019-07-05 ENCOUNTER — Other Ambulatory Visit: Payer: Self-pay

## 2019-07-05 DIAGNOSIS — R590 Localized enlarged lymph nodes: Secondary | ICD-10-CM | POA: Insufficient documentation

## 2019-07-05 DIAGNOSIS — R0981 Nasal congestion: Secondary | ICD-10-CM | POA: Diagnosis not present

## 2019-07-05 DIAGNOSIS — Z5321 Procedure and treatment not carried out due to patient leaving prior to being seen by health care provider: Secondary | ICD-10-CM | POA: Insufficient documentation

## 2019-07-06 ENCOUNTER — Emergency Department
Admission: EM | Admit: 2019-07-06 | Discharge: 2019-07-06 | Disposition: A | Discharge status: Home / Self Care | Payer: BC Managed Care – PPO | Attending: Emergency Medicine | Admitting: Emergency Medicine

## 2019-07-06 DIAGNOSIS — R05 Cough: Secondary | ICD-10-CM | POA: Diagnosis not present

## 2019-07-06 DIAGNOSIS — J019 Acute sinusitis, unspecified: Secondary | ICD-10-CM | POA: Diagnosis not present

## 2019-07-06 NOTE — ED Notes (Signed)
Unable to locate patient to give an update.

## 2019-07-06 NOTE — ED Triage Notes (Signed)
Patient arrived via POV, from home, patient is AOx4 and ambulatory. Patient chief complaint is bilateral swollen lymphnodes on neck and nasal congestion. Patient states that face and head feel stuffed. Patient is in no distress other than BP being elevated due to not taking it today and also taking allergy medication.

## 2019-09-17 ENCOUNTER — Other Ambulatory Visit: Payer: Self-pay

## 2019-09-17 ENCOUNTER — Encounter: Payer: Self-pay | Admitting: Emergency Medicine

## 2019-09-17 ENCOUNTER — Emergency Department
Admission: EM | Admit: 2019-09-17 | Discharge: 2019-09-17 | Disposition: A | Discharge status: Home / Self Care | Payer: Self-pay | Attending: Emergency Medicine | Admitting: Emergency Medicine

## 2019-09-17 DIAGNOSIS — I10 Essential (primary) hypertension: Secondary | ICD-10-CM

## 2019-09-17 DIAGNOSIS — N3000 Acute cystitis without hematuria: Secondary | ICD-10-CM | POA: Diagnosis not present

## 2019-09-17 DIAGNOSIS — S39012A Strain of muscle, fascia and tendon of lower back, initial encounter: Secondary | ICD-10-CM | POA: Diagnosis not present

## 2019-09-17 DIAGNOSIS — Z79899 Other long term (current) drug therapy: Secondary | ICD-10-CM | POA: Insufficient documentation

## 2019-09-17 DIAGNOSIS — Y939 Activity, unspecified: Secondary | ICD-10-CM | POA: Insufficient documentation

## 2019-09-17 DIAGNOSIS — Y929 Unspecified place or not applicable: Secondary | ICD-10-CM | POA: Insufficient documentation

## 2019-09-17 DIAGNOSIS — Z760 Encounter for issue of repeat prescription: Secondary | ICD-10-CM | POA: Diagnosis not present

## 2019-09-17 DIAGNOSIS — B9689 Other specified bacterial agents as the cause of diseases classified elsewhere: Secondary | ICD-10-CM | POA: Insufficient documentation

## 2019-09-17 DIAGNOSIS — Z7982 Long term (current) use of aspirin: Secondary | ICD-10-CM | POA: Insufficient documentation

## 2019-09-17 DIAGNOSIS — X500XXA Overexertion from strenuous movement or load, initial encounter: Secondary | ICD-10-CM | POA: Insufficient documentation

## 2019-09-17 DIAGNOSIS — Y999 Unspecified external cause status: Secondary | ICD-10-CM | POA: Insufficient documentation

## 2019-09-17 LAB — CBC
HCT: 43.6 % (ref 39.0–52.0)
Hemoglobin: 14.4 g/dL (ref 13.0–17.0)
MCH: 28.2 pg (ref 26.0–34.0)
MCHC: 33 g/dL (ref 30.0–36.0)
MCV: 85.3 fL (ref 80.0–100.0)
Platelets: 276 10*3/uL (ref 150–400)
RBC: 5.11 MIL/uL (ref 4.22–5.81)
RDW: 12.6 % (ref 11.5–15.5)
WBC: 9.4 10*3/uL (ref 4.0–10.5)
nRBC: 0 % (ref 0.0–0.2)

## 2019-09-17 LAB — COMPREHENSIVE METABOLIC PANEL
ALT: 18 U/L (ref 0–44)
AST: 19 U/L (ref 15–41)
Albumin: 4.4 g/dL (ref 3.5–5.0)
Alkaline Phosphatase: 87 U/L (ref 38–126)
Anion gap: 9 (ref 5–15)
BUN: 18 mg/dL (ref 6–20)
CO2: 28 mmol/L (ref 22–32)
Calcium: 9.9 mg/dL (ref 8.9–10.3)
Chloride: 103 mmol/L (ref 98–111)
Creatinine, Ser: 1.23 mg/dL (ref 0.61–1.24)
GFR calc Af Amer: >60 mL/min (ref >60)
GFR calc non Af Amer: >60 mL/min (ref >60)
Glucose, Bld: 104 mg/dL — ABNORMAL HIGH (ref 70–99)
Potassium: 3.7 mmol/L (ref 3.5–5.1)
Sodium: 140 mmol/L (ref 135–145)
Total Bilirubin: 0.7 mg/dL (ref 0.3–1.2)
Total Protein: 8.4 g/dL — ABNORMAL HIGH (ref 6.5–8.1)

## 2019-09-17 LAB — URINALYSIS, COMPLETE (UACMP) WITH MICROSCOPIC
Bilirubin Urine: NEGATIVE
Glucose, UA: NEGATIVE mg/dL
Hgb urine dipstick: NEGATIVE
Ketones, ur: NEGATIVE mg/dL
Nitrite: NEGATIVE
Protein, ur: NEGATIVE mg/dL
Specific Gravity, Urine: 1.012 (ref 1.005–1.030)
WBC, UA: >50 WBC/hpf — ABNORMAL HIGH (ref 0–5)
pH: 6 (ref 5.0–8.0)

## 2019-09-17 MED ORDER — CYCLOBENZAPRINE HCL 10 MG PO TABS: 10.0000 mg | ORAL_TABLET | Freq: Three times a day (TID) | ORAL | 0 refills | Status: DC | PRN

## 2019-09-17 MED ORDER — TRAMADOL HCL 50 MG PO TABS: 50.0000 mg | ORAL_TABLET | Freq: Four times a day (QID) | ORAL | 0 refills | Status: AC | PRN

## 2019-09-17 MED ORDER — OLMESARTAN-AMLODIPINE-HCTZ 40-10-25 MG PO TABS: 1.0000 | ORAL_TABLET | Freq: Every day | ORAL | 0 refills | Status: DC

## 2019-09-17 MED ORDER — LIDOCAINE 5 % EX PTCH
1.0000 | MEDICATED_PATCH | CUTANEOUS | Status: DC
  Administered 2019-09-17: 1 via TRANSDERMAL
  Filled 2019-09-17: qty 1

## 2019-09-17 MED ORDER — METOPROLOL SUCCINATE ER 50 MG PO TB24: ORAL_TABLET | ORAL | 0 refills | Status: DC

## 2019-09-17 NOTE — ED Provider Notes (Signed)
[facility] Emergency Department Provider Note   ____________________________________________   First MD Initiated Contact with Patient 09/17/19 1221     (approximate)  I have reviewed the triage vital signs and the nursing notes.   HISTORY  Chief Complaint Medication Refill and Back Pain    HPI John Lowe is a 39 y.o. male patient presents with low back pain for 3 days secondary to lifting incident.  Patient also requests a refill of his hypertension medication.  Patient states his previous PCP is no longer associated with the clinic.  Patient stated he is finding it difficult to find a new PCP because many are not accepting new patients.  Patient denies radicular component to his back pain.  Patient denies bladder bowel dysfunction.  Patient state he has been without his hypertension medication for over 2 weeks.  Patient denies vision disturbance, vertigo, or weakness.  Patient rates his back pain is a 9/10.  Patient described his pain as "achy/spasmatic".  No palliative measure for complaint.         Past Medical History:  Diagnosis Date  . Hyperlipidemia   . Hypertension   . Obesity, Class III, BMI 40-49.9 (morbid obesity) (HCC)     Patient Active Problem List   Diagnosis Date Noted  . Elevated glucose 04/01/2017  . Medication monitoring encounter 10/02/2015  . Hypertension 08/15/2015  . Hyperlipidemia 08/15/2015  . Allergic rhinitis 08/15/2015  . Obesity, Class III, BMI 40-49.9 (morbid obesity) (HCC) 08/31/2014    Past Surgical History:  Procedure Laterality Date  . ANKLE SURGERY    . rotator cuff Right     Prior to Admission medications   Medication Sig Start Date End Date Taking? Authorizing Provider  aspirin 81 MG tablet Take 81 mg by mouth daily.    [provider]  atorvastatin (LIPITOR) 10 MG tablet Take 1 tablet (10 mg total) by mouth at bedtime. 03/12/18   Kerman Passey, MD  cyclobenzaprine (FLEXERIL) 10 MG  tablet Take 1 tablet (10 mg total) by mouth 3 (three) times daily as needed. 09/17/19   Joni Reining, PA-C  fluticasone (FLONASE) 50 MCG/ACT nasal spray Place 1 spray into both nostrils 2 (two) times daily. 04/01/17   Kerman Passey, MD  metoprolol succinate (TOPROL-XL) 50 MG 24 hr tablet TAKE 1 TABLET BY MOUTH ONCE DAILY (LAST  REFILL,  NEEDS  FOLLOW  UP) 09/17/19   Joni Reining, PA-C  Olmesartan-amLODIPine-HCTZ 40-10-25 MG TABS Take 1 tablet by mouth daily. 09/17/19   Joni Reining, PA-C  traMADol (ULTRAM) 50 MG tablet Take 1 tablet (50 mg total) by mouth every 6 (six) hours as needed. 09/17/19 09/16/20  Joni Reining, PA-C    Allergies Patient has no known allergies.  Family History  Problem Relation Age of Onset  . Hypertension Mother   . Hypertension Father   . Heart disease Father   . Kidney disease Father   . Hypertension Sister   . Hypertension Brother   . Cancer Maternal Grandmother        breast  . Cancer Maternal Grandfather        bone cancer  . Hypertension Paternal Grandmother     Social History Social History   Tobacco Use  . Smoking status: Never Smoker  . Smokeless tobacco: Never Used  Vaping Use  . Vaping Use: Never used  Substance Use Topics  . Alcohol use: No  . Drug use: No    Review of Systems  Constitutional: No fever/chills Eyes: No visual changes. ENT: No sore throat. Cardiovascular: Denies chest pain. Respiratory: Denies shortness of breath. Gastrointestinal: No abdominal pain.  No nausea, no vomiting.  No diarrhea.  No constipation. Genitourinary: Negative for dysuria. Musculoskeletal: Positive for back pain. Skin: Negative for rash. Neurological: Negative for headaches, focal weakness or numbness. Endocrine:  Hyperlipidemia and hypertension   ____________________________________________   PHYSICAL EXAM:  VITAL SIGNS: ED Triage Vitals  Enc Vitals Group     BP 09/17/19 1150 (!) 196/124     Pulse Rate 09/17/19 1150 83     Resp  09/17/19 1150 18     Temp 09/17/19 1150 98.6 F (37 C)     Temp Source 09/17/19 1150 Oral     SpO2 09/17/19 1150 97 %     Weight 09/17/19 1152 (!) 358 lb (162.4 kg)     Height 09/17/19 1152 6\' 3"  (1.905 m)     Head Circumference --      Peak Flow --      Pain Score 09/17/19 1156 9     Pain Loc --      Pain Edu? --      Excl. in GC? --    Constitutional: Alert and oriented. Well appearing and in no acute distress.  BMI is 44.75. Cardiovascular: Normal rate, regular rhythm. Grossly normal heart sounds.  Good peripheral circulation.  Elevated blood pressure. Respiratory: Normal respiratory effort.  No retractions. Lungs CTAB. Musculoskeletal: No obvious lumbar spine deformity.  Patient is moderate guarding palpation L4-S1.  Patient decreased range of motion with flexion. Neurologic:  Normal speech and language. No gross focal neurologic deficits are appreciated. No gait instability. Skin:  Skin is warm, dry and intact. No rash noted. Psychiatric: Mood and affect are normal. Speech and behavior are normal.  ____________________________________________   LABS (all labs ordered are listed, but only abnormal results are displayed)  Labs Reviewed - No data to display ____________________________________________  EKG   ____________________________________________  RADIOLOGY  ED MD interpretation:    Official radiology report(s): No results found.  ____________________________________________   PROCEDURES  Procedure(s) performed (including Critical Care):  Procedures   ____________________________________________   INITIAL IMPRESSION / ASSESSMENT AND PLAN / ED COURSE  As part of my medical decision making, I reviewed the following data within the electronic MEDICAL RECORD NUMBER     Patient presents with low back pain secondary to strain.  Patient also requesting refill of blood pressure medications.  Patient given discharge care instructions and advised to establish care  with PCP.   John Lowe was evaluated in Emergency Department on 09/17/2019 for the symptoms described in the history of present illness. He was evaluated in the context of the global COVID-19 pandemic, which necessitated consideration that the patient might be at risk for infection with the SARS-CoV-2 virus that causes COVID-19. Institutional protocols and algorithms that pertain to the evaluation of patients at risk for COVID-19 are in a state of rapid change based on information released by regulatory bodies including the CDC and federal and state organizations. These policies and algorithms were followed during the patient's care in the ED.       ____________________________________________   FINAL CLINICAL IMPRESSION(S) / ED DIAGNOSES  Final diagnoses:  Strain of lumbar region, initial encounter  Encounter for medication refill  Essential hypertension     ED Discharge Orders         Ordered    metoprolol succinate (TOPROL-XL) 50 MG 24 hr tablet  Discontinue  Reprint     09/17/19 1230    Olmesartan-amLODIPine-HCTZ 40-10-25 MG TABS  Daily     Discontinue  Reprint     09/17/19 1230    cyclobenzaprine (FLEXERIL) 10 MG tablet  3 times daily PRN     Discontinue  Reprint     09/17/19 1230    traMADol (ULTRAM) 50 MG tablet  Every 6 hours PRN     Discontinue  Reprint     09/17/19 1230           Note:  This document was prepared using Dragon voice recognition software and may include unintentional dictation errors.    Joni Reining, PA-C 09/17/19 1241    Sharman Cheek, MD 09/17/19 [phone]

## 2019-09-17 NOTE — ED Notes (Signed)
Pt would like refill of BP med, his PCP left and he would like referral to PCP

## 2019-09-17 NOTE — ED Triage Notes (Signed)
Pt in with co lower abd pain since Sunday, was seen here earlier for back pain but did not mention abdominal pain. Pt states he does have some dysuria, no fever or n.v.d. Pt states does have htn but was not on meds but did get rx today. States has only had 1 dose of bp med.

## 2019-09-17 NOTE — ED Triage Notes (Signed)
Pt presents to ED via POV, ambulatory to triage room. Pt c/o R lower back pain since Sunday, states worse in the morning.  Pt also states PCP recently changed, has been looking for a new one and has run out of his HTN medications. Pt states was taking Toprol XL 50mg , and Olmesartan-Amlodipine-HCTZ 10-10-25mg .

## 2019-09-18 ENCOUNTER — Emergency Department
Admission: EM | Admit: 2019-09-18 | Discharge: 2019-09-18 | Disposition: A | Discharge status: Home / Self Care | Payer: Self-pay | Attending: Emergency Medicine | Admitting: Emergency Medicine

## 2019-09-18 ENCOUNTER — Emergency Department: Payer: Self-pay

## 2019-09-18 DIAGNOSIS — N3000 Acute cystitis without hematuria: Secondary | ICD-10-CM | POA: Diagnosis not present

## 2019-09-18 DIAGNOSIS — I10 Essential (primary) hypertension: Secondary | ICD-10-CM | POA: Diagnosis not present

## 2019-09-18 LAB — CHLAMYDIA/NGC RT PCR (ARMC ONLY)
Chlamydia Tr: NOT DETECTED
N gonorrhoeae: NOT DETECTED

## 2019-09-18 MED ORDER — CEPHALEXIN 500 MG PO CAPS
500.0000 mg | ORAL_CAPSULE | Freq: Three times a day (TID) | ORAL | 0 refills | Status: AC
Start: 2019-09-18 — End: 2019-09-25

## 2019-09-18 MED ORDER — FENTANYL CITRATE (PF) 100 MCG/2ML IJ SOLN
50.0000 ug | Freq: Once | INTRAMUSCULAR | Status: AC
  Administered 2019-09-18: 50 ug via INTRAVENOUS
  Filled 2019-09-18: qty 2

## 2019-09-18 MED ORDER — CEPHALEXIN 500 MG PO CAPS
500.0000 mg | ORAL_CAPSULE | Freq: Once | ORAL | Status: AC
  Administered 2019-09-18: 500 mg via ORAL
  Filled 2019-09-18: qty 1

## 2019-09-18 MED ORDER — ONDANSETRON HCL 4 MG/2ML IJ SOLN
4.0000 mg | Freq: Once | INTRAMUSCULAR | Status: AC
  Administered 2019-09-18: 4 mg via INTRAVENOUS
  Filled 2019-09-18: qty 2

## 2019-09-18 MED ORDER — IOHEXOL 350 MG/ML SOLN
125.0000 mL | Freq: Once | INTRAVENOUS | Status: AC | PRN
  Administered 2019-09-18: 125 mL via INTRAVENOUS

## 2019-09-18 NOTE — ED Notes (Signed)
Pt reports low back pain that radiates to his lower abdomen near the pubic area. No tenderness with palpation. States he was seen earlier yesterday prior to this visit and dx with back spasms. Reports taking first dose of bp meds at home yesterday when asked about bp

## 2019-09-18 NOTE — ED Notes (Signed)
Pt to CT

## 2019-09-18 NOTE — ED Provider Notes (Signed)
[facility] Emergency Department Provider Note  ____________________________________________  Time seen: Approximately 6:07 AM  I have reviewed the triage vital signs and the nursing notes.   HISTORY  Chief Complaint Abdominal Pain   HPI Gotti Alwin is a 39 y.o. male the history of obesity, hypertension, hyperlipidemia who presents for evaluation of abdominal pain.  Patient was seen here earlier today for abdominal pain which he has had for 3 days secondary to lifting incident.  The pain is in his lumbar midline.  He reports that after he was discharged the pain started to move across to his abdomen.  He is complaining of pain on the lower quadrants which is sharp, constant, severe in intensity.  He also has had 2 to 3 days of dysuria.  No fever or chills, no nausea or vomiting, no constipation or diarrhea.  No chest pain or shortness of breath, no saddle anesthesia, no lower extremities weakness or numbness, no urinary or bowel incontinence or retention.  No prior abdominal surgeries.   Past Medical History:  Diagnosis Date   Hyperlipidemia    Hypertension    Obesity, Class III, BMI 40-49.9 (morbid obesity) (HCC)     Patient Active Problem List   Diagnosis Date Noted   Elevated glucose 04/01/2017   Medication monitoring encounter 10/02/2015   Hypertension 08/15/2015   Hyperlipidemia 08/15/2015   Allergic rhinitis 08/15/2015   Obesity, Class III, BMI 40-49.9 (morbid obesity) (HCC) 08/31/2014    Past Surgical History:  Procedure Laterality Date   ANKLE SURGERY     rotator cuff Right     Prior to Admission medications   Medication Sig Start Date End Date Taking? Authorizing Provider  aspirin 81 MG tablet Take 81 mg by mouth daily.    [provider]  atorvastatin (LIPITOR) 10 MG tablet Take 1 tablet (10 mg total) by mouth at bedtime. 03/12/18   Lada, Janit Bern, MD  cephALEXin (KEFLEX) 500 MG capsule Take 1 capsule (500 mg  total) by mouth 3 (three) times daily for 7 days. 09/18/19 09/25/19  Nita Sickle, MD  cyclobenzaprine (FLEXERIL) 10 MG tablet Take 1 tablet (10 mg total) by mouth 3 (three) times daily as needed. 09/17/19   Joni Reining, PA-C  fluticasone (FLONASE) 50 MCG/ACT nasal spray Place 1 spray into both nostrils 2 (two) times daily. 04/01/17   Kerman Passey, MD  metoprolol succinate (TOPROL-XL) 50 MG 24 hr tablet TAKE 1 TABLET BY MOUTH ONCE DAILY (LAST  REFILL,  NEEDS  FOLLOW  UP) 09/17/19   Joni Reining, PA-C  Olmesartan-amLODIPine-HCTZ 40-10-25 MG TABS Take 1 tablet by mouth daily. 09/17/19   Joni Reining, PA-C  traMADol (ULTRAM) 50 MG tablet Take 1 tablet (50 mg total) by mouth every 6 (six) hours as needed. 09/17/19 09/16/20  Joni Reining, PA-C    Allergies Patient has no known allergies.  Family History  Problem Relation Age of Onset   Hypertension Mother    Hypertension Father    Heart disease Father    Kidney disease Father    Hypertension Sister    Hypertension Brother    Cancer Maternal Grandmother        breast   Cancer Maternal Grandfather        bone cancer   Hypertension Paternal Grandmother     Social History Social History   Tobacco Use   Smoking status: Never Smoker   Smokeless tobacco: Never Used  Building services engineer Use: Never  used  Substance Use Topics   Alcohol use: No   Drug use: No    Review of Systems  Constitutional: Negative for fever. Eyes: Negative for visual changes. ENT: Negative for sore throat. Neck: No neck pain  Cardiovascular: Negative for chest pain. Respiratory: Negative for shortness of breath. Gastrointestinal: + lower abdominal pain. No vomiting or diarrhea. Genitourinary: + dysuria. Musculoskeletal: Negative for back pain. Skin: Negative for rash. Neurological: Negative for headaches, weakness or numbness. Psych: No SI or HI  ____________________________________________   PHYSICAL EXAM:  VITAL  SIGNS: Vitals:   09/17/19 2252 09/18/19 0507  BP: (!) 229/145 (!) 182/114  Pulse: 81 62  Resp: 20 18  Temp: 98 F (36.7 C)   SpO2: 100% 98%    Constitutional: Alert and oriented. Well appearing and in no apparent distress. HEENT:      Head: Normocephalic and atraumatic.         Eyes: Conjunctivae are normal. Sclera is non-icteric.       Mouth/Throat: Mucous membranes are moist.       Neck: Supple with no signs of meningismus. Cardiovascular: Regular rate and rhythm. No murmurs, gallops, or rubs. 2+ symmetrical distal pulses are present in all extremities.  Respiratory: Normal respiratory effort. Lungs are clear to auscultation bilaterally. No wheezes, crackles, or rhonchi.  Gastrointestinal: Obese with diffuse tenderness in the lower quadrants, no rebound or guarding  Genitourinary: No CVA tenderness. Musculoskeletal:  No edema, cyanosis, or erythema of extremities. Neurologic: Normal speech and language. Face is symmetric. Moving all extremities. No gross focal neurologic deficits are appreciated. Skin: Skin is warm, dry and intact. No rash noted. Psychiatric: Mood and affect are normal. Speech and behavior are normal.  ____________________________________________   LABS (all labs ordered are listed, but only abnormal results are displayed)  Labs Reviewed  COMPREHENSIVE METABOLIC PANEL - Abnormal; Notable for the following components:      Result Value   Glucose, Bld 104 (*)    Total Protein 8.4 (*)    All other components within normal limits  URINALYSIS, COMPLETE (UACMP) WITH MICROSCOPIC - Abnormal; Notable for the following components:   Color, Urine YELLOW (*)    APPearance HAZY (*)    Leukocytes,Ua MODERATE (*)    WBC, UA >50 (*)    Bacteria, UA RARE (*)    All other components within normal limits  CHLAMYDIA/NGC RT PCR ([facility] ONLY)  CBC   ____________________________________________  EKG  ED ECG REPORT I, Nita Sickle, the attending physician,  personally viewed and interpreted this ECG.  Normal sinus rhythm, rate of 67, normal intervals, normal axis, no ST elevations or depressions, anterior Q waves.  Unchanged from prior. ____________________________________________  RADIOLOGY  I have personally reviewed the images performed during this visit and I agree with the Radiologist's read.   Interpretation by Radiologist:  CT ABDOMEN PELVIS W CONTRAST  Result Date: 09/18/2019 CLINICAL DATA:  Lower abdominal and back pain. Dysuria. EXAM: CT ABDOMEN AND PELVIS WITH CONTRAST TECHNIQUE: Multidetector CT imaging of the abdomen and pelvis was performed using the standard protocol following bolus administration of intravenous contrast. CONTRAST:  OMNIPAQUE IOHEXOL 350 MG/ML SOLN COMPARISON:  None. FINDINGS: Lower chest: The lung bases are clear of acute process. No pleural effusion or pulmonary lesions. The heart is normal in size. No pericardial effusion. The distal esophagus and aorta are unremarkable. Mild asymmetric left-sided gynecomastia is noted. Recommend clinical correlation. Hepatobiliary: No hepatic lesions or intrahepatic biliary dilatation. The gallbladder appears normal. No common bile duct  dilatation. Pancreas: No mass, inflammation or ductal dilatation. Spleen: Normal size. No focal lesions. Adrenals/Urinary Tract: The adrenal glands and kidneys are unremarkable. The bladder is normal. Stomach/Bowel: The stomach, duodenum, small bowel and colon are grossly normal without oral contrast. No acute inflammatory changes, mass lesions or obstructive findings. The terminal ileum is normal. The appendix is normal except for a tiny appendicolith. Vascular/Lymphatic: The aorta is normal in caliber. No dissection. The branch vessels are patent. The major venous structures are patent. No mesenteric or retroperitoneal mass or adenopathy. Small scattered lymph nodes are noted. Reproductive: The prostate gland and seminal vesicles are unremarkable.  Other: No pelvic mass or adenopathy. No free pelvic fluid collections. No inguinal mass or adenopathy. No abdominal wall hernia or subcutaneous lesions. Musculoskeletal: Age advanced degenerative changes involving the thoracic and lumbar spines. 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 IMPRESSION: 1. No acute abdominal/pelvic findings, mass lesions or adenopathy. 2. Age advanced degenerative changes involving the thoracic and lumbar spines. 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 3. Mild asymmetric left-sided gynecomastia. Recommend clinical correlation. Electronically Signed   By: Rudie Meyer M.D.   On: 09/18/2019 06:31      ____________________________________________   PROCEDURES  Procedure(s) performed:yes .1-3 Lead EKG Interpretation Performed by: Nita Sickle, MD Authorized by: Nita Sickle, MD     Critical Care performed:  None ____________________________________________   INITIAL IMPRESSION / ASSESSMENT AND PLAN / ED COURSE   39 y.o. male the history of obesity, hypertension, hyperlipidemia who presents for evaluation of abdominal pain.  Initially seen earlier today for back pain after straining couple days ago which is worse with movement of the torso.  Reports that after going home the pain started radiating across the bilateral lower abdominal area.  Also has had 2 days of dysuria.  Patient is hypertensive but does have a history of such and was not taking his medications for a while.  He just restarted them today.  Otherwise neuro intact with strong pulses in all 4 extremities.  Abdominal exam is somewhat limited due to body habitus the patient is tender to palpation on all lower quadrants with no rebound or guarding.  Ddx UTI, STD, pyelonephritis, kidney stone, appendicitis, diverticulitis, MSK.  Labs show no leukocytosis, no anemia, no significant electrolyte derangements, normal LFTs and lipase, UA positive for UTI, GC chlamydia negative.  CT abdomen  pelvis showed no acute abnormalities, confirmed by radiology. Patient given IV fentanyl for pain, and keflex for UTI. No signs of sepsis. Old medical records reviewed.  Patient be discharged home on Keflex and follow-up with PCP.  Discussed my standard return precautions.  Old medical records reviewed.    _____________________________________________ Please note:  Patient was evaluated in Emergency Department today for the symptoms described in the history of present illness. Patient was evaluated in the context of the global COVID-19 pandemic, which necessitated consideration that the patient might be at risk for infection with the SARS-CoV-2 virus that causes COVID-19. Institutional protocols and algorithms that pertain to the evaluation of patients at risk for COVID-19 are in a state of rapid change based on information released by regulatory bodies including the CDC and federal and state organizations. These policies and algorithms were followed during the patient's care in the ED.  Some ED evaluations and interventions may be delayed as a result of limited staffing during the pandemic.   [place] Controlled Substance Database was reviewed by me. ____________________________________________   FINAL CLINICAL IMPRESSION(S) / ED DIAGNOSES   Final diagnoses:  Acute cystitis without hematuria      NEW MEDICATIONS STARTED DURING THIS VISIT:  ED Discharge Orders         Ordered    cephALEXin (KEFLEX) 500 MG capsule  3 times daily     Discontinue  Reprint     09/18/19 0640           Note:  This document was prepared using Dragon voice recognition software and may include unintentional dictation errors.    Don PerkingVeronese, [place], MD 09/18/19 [phone]

## 2020-03-21 ENCOUNTER — Encounter: Payer: Self-pay | Admitting: Family Medicine

## 2020-03-21 ENCOUNTER — Other Ambulatory Visit: Payer: Self-pay

## 2020-03-21 ENCOUNTER — Ambulatory Visit
Admission: EM | Admit: 2020-03-21 | Discharge: 2020-03-21 | Disposition: A | Discharge status: Home / Self Care | Payer: BC Managed Care – PPO | Attending: Family Medicine | Admitting: Family Medicine

## 2020-03-21 ENCOUNTER — Ambulatory Visit (INDEPENDENT_AMBULATORY_CARE_PROVIDER_SITE_OTHER): Payer: BC Managed Care – PPO

## 2020-03-21 ENCOUNTER — Ambulatory Visit: Payer: Self-pay

## 2020-03-21 DIAGNOSIS — W109XXA Fall (on) (from) unspecified stairs and steps, initial encounter: Secondary | ICD-10-CM

## 2020-03-21 DIAGNOSIS — S8991XA Unspecified injury of right lower leg, initial encounter: Secondary | ICD-10-CM

## 2020-03-21 DIAGNOSIS — M25561 Pain in right knee: Secondary | ICD-10-CM | POA: Diagnosis not present

## 2020-03-21 DIAGNOSIS — I10 Essential (primary) hypertension: Secondary | ICD-10-CM

## 2020-03-21 MED ORDER — OLMESARTAN-AMLODIPINE-HCTZ 40-10-25 MG PO TABS: 1.0000 | ORAL_TABLET | Freq: Every day | ORAL | 0 refills | Status: DC

## 2020-03-21 MED ORDER — MELOXICAM 15 MG PO TABS: 15.0000 mg | ORAL_TABLET | Freq: Every day | ORAL | 0 refills | Status: DC

## 2020-03-21 NOTE — ED Provider Notes (Signed)
John Lowe    CSN: [id number] Arrival date & time: 03/21/20  0950      History   Chief Complaint Chief Complaint  Patient presents with  . Knee Injury    HPI John Lowe is a 40 y.o. male.   Pt is a 40 year old male that presents with right knee pain.  This is been present for the last 4 days.  Reporting that he slipped and fell on the steps on this past Friday.  Symptoms ice.  Landed on the right knee.  He has since had pain described as sharp, shooting pain that increases with inflammatory range of motion.  Has been applying ice and icy hot patch     Past Medical History:  Diagnosis Date  . Hyperlipidemia   . Hypertension   . Obesity, Class III, BMI 40-49.9 (morbid obesity) (HCC)     Patient Active Problem List   Diagnosis Date Noted  . Elevated glucose 04/01/2017  . Medication monitoring encounter 10/02/2015  . Hypertension 08/15/2015  . Hyperlipidemia 08/15/2015  . Allergic rhinitis 08/15/2015  . Obesity, Class III, BMI 40-49.9 (morbid obesity) (HCC) 08/31/2014    Past Surgical History:  Procedure Laterality Date  . ANKLE SURGERY    . rotator cuff Right        Home Medications    Prior to Admission medications   Medication Sig Start Date End Date Taking? Authorizing Provider  aspirin 81 MG tablet Take 81 mg by mouth daily.    [provider]  atorvastatin (LIPITOR) 10 MG tablet Take 1 tablet (10 mg total) by mouth at bedtime. 03/12/18   Kerman Passey, MD  cyclobenzaprine (FLEXERIL) 10 MG tablet Take 1 tablet (10 mg total) by mouth 3 (three) times daily as needed. 09/17/19   Joni Reining, PA-C  fluticasone (FLONASE) 50 MCG/ACT nasal spray Place 1 spray into both nostrils 2 (two) times daily. 04/01/17   Kerman Passey, MD  meloxicam (MOBIC) 15 MG tablet Take 1 tablet (15 mg total) by mouth daily. 03/21/20   Dahlia Byes A, NP  metoprolol succinate (TOPROL-XL) 50 MG 24 hr tablet TAKE 1 TABLET BY MOUTH ONCE DAILY (LAST  REFILL,   NEEDS  FOLLOW  UP) 09/17/19   Joni Reining, PA-C  Olmesartan-amLODIPine-HCTZ 40-10-25 MG TABS Take 1 tablet by mouth daily. 03/21/20   Dahlia Byes A, NP  traMADol (ULTRAM) 50 MG tablet Take 1 tablet (50 mg total) by mouth every 6 (six) hours as needed. 09/17/19 09/16/20  Joni Reining, PA-C    Family History Family History  Problem Relation Age of Onset  . Hypertension Mother   . Hypertension Father   . Heart disease Father   . Kidney disease Father   . Hypertension Sister   . Hypertension Brother   . Cancer Maternal Grandmother        breast  . Cancer Maternal Grandfather        bone cancer  . Hypertension Paternal Grandmother     Social History Social History   Tobacco Use  . Smoking status: Never Smoker  . Smokeless tobacco: Never Used  Vaping Use  . Vaping Use: Never used  Substance Use Topics  . Alcohol use: No  . Drug use: No     Allergies   Patient has no known allergies.   Review of Systems Review of Systems   Physical Exam Triage Vital Signs ED Triage Vitals  Enc Vitals Group     BP 03/21/20  1008 (!) 220/141     Pulse Rate 03/21/20 1008 85     Resp 03/21/20 1008 18     Temp 03/21/20 1008 99 F (37.2 C)     Temp Source 03/21/20 1008 Oral     SpO2 03/21/20 1008 97 %     Weight --      Height --      Head Circumference --      Peak Flow --      Pain Score 03/21/20 1005 8     Pain Loc --      Pain Edu? --      Excl. in GC? --    No data found.  Updated Vital Signs BP (!) 220/141 (BP Location: Left Arm) Comment: Pt took BP meds, baseline with meds 160's.  Pulse 85   Temp 99 F (37.2 C) (Oral)   Resp 18   SpO2 97%   Visual Acuity Right Eye Distance:   Left Eye Distance:   Bilateral Distance:    Right Eye Near:   Left Eye Near:    Bilateral Near:     Physical Exam Vitals and nursing note reviewed.  Constitutional:      Appearance: Normal appearance.  HENT:     Head: Normocephalic and atraumatic.     Nose: Nose normal.  Eyes:      Conjunctiva/sclera: Conjunctivae normal.  Pulmonary:     Effort: Pulmonary effort is normal.  Abdominal:     Palpations: Abdomen is soft.     Tenderness: There is no abdominal tenderness.  Musculoskeletal:     Cervical back: Normal range of motion.     Right knee: Swelling present. Decreased range of motion. Tenderness present over the MCL and LCL.       Legs:     Comments: Most of pain where highlighted. Limited flexion  No laxity  Skin:    General: Skin is warm and dry.  Neurological:     Mental Status: He is alert.  Psychiatric:        Mood and Affect: Mood normal.      UC Treatments / Results  Labs (all labs ordered are listed, but only abnormal results are displayed) Labs Reviewed - No data to display  EKG   Radiology DG Knee Complete 4 Views Right  Result Date: 03/21/2020 CLINICAL DATA:  Right knee pain status post fall EXAM: RIGHT KNEE - COMPLETE 4+ VIEW COMPARISON:  None. FINDINGS: Mild joint space loss and spurring of the medial, lateral, patellofemoral compartments. No fracture or dislocation. Small knee joint effusion. IMPRESSION: No acute fracture or dislocation of the right knee. Electronically Signed   By: Acquanetta Belling M.D.   On: 03/21/2020 10:49    Procedures Procedures (including critical care time)  Medications Ordered in UC Medications - No data to display  Initial Impression / Assessment and Plan / UC Course  I have reviewed the triage vital signs and the nursing notes.  Pertinent labs & imaging results that were available during my care of the patient were reviewed by me and considered in my medical decision making (see chart for details).     Right knee injury No acute concerns on x-ray.  No fractures. Most likely ligamentous sprain possible MCL strain Patient has knee sleeve at home that he is going to start using.  Recommended use this until feeling better.  Then use as needed.  Rest, ice, elevate.  Prescribing meloxicam for pain,  inflammation and swelling. See ortho for  any continued issues.   Essential hypertension. Patient requesting refill on his blood pressure medications today.  Patient's blood pressure is 220/141.  Was checked multiple times in clinic today.  No other associated red flags with this high blood pressure.  He does not currently have a PCP Recommend keep monitoring blood pressures at home  Final Clinical Impressions(s) / UC Diagnoses   Final diagnoses:  Injury of right knee, initial encounter  Essential hypertension     Discharge Instructions     Your x ray was normal Recommend rest, Ice, Elevate the knee Prescribing meloxicam for pain, inflammation and swelling  Wear the knee sleeve you have at home   Follow up as needed for continued or worsening symptoms     ED Prescriptions    Medication Sig Dispense Auth. Provider   meloxicam (MOBIC) 15 MG tablet  (Status: Discontinued) Take 1 tablet (15 mg total) by mouth daily. 15 tablet Ryah Cribb A, NP   meloxicam (MOBIC) 15 MG tablet Take 1 tablet (15 mg total) by mouth daily. 15 tablet Sondra Blixt A, NP   Olmesartan-amLODIPine-HCTZ 40-10-25 MG TABS Take 1 tablet by mouth daily. 90 tablet Euva Rundell A, NP     PDMP not reviewed this encounter.   Janace Aris, NP 03/21/20 1205

## 2020-03-21 NOTE — Discharge Instructions (Addendum)
Your x ray was normal Recommend rest, Ice, Elevate the knee Prescribing meloxicam for pain, inflammation and swelling  Wear the knee sleeve you have at home   Follow up as needed for continued or worsening symptoms

## 2020-03-21 NOTE — ED Triage Notes (Signed)
Patient presents to Urgent Care with complaints of right knee injury patient states he slipped and fell from steps on Friday. Reports pain level a 8/10. Pt states he applied a ice to knee and icy patch yesterday. He describes pain as a shooting pain and increases when applying weight or ROM.

## 2020-03-29 ENCOUNTER — Ambulatory Visit: Payer: Self-pay

## 2021-05-14 ENCOUNTER — Emergency Department: Payer: PRIVATE HEALTH INSURANCE

## 2021-05-14 ENCOUNTER — Inpatient Hospital Stay
Admission: EM | Admit: 2021-05-14 | Discharge: 2021-05-16 | DRG: 291 | Disposition: A | Discharge status: Home / Self Care | Payer: Self-pay | Attending: Internal Medicine | Admitting: Internal Medicine

## 2021-05-14 ENCOUNTER — Other Ambulatory Visit: Payer: Self-pay

## 2021-05-14 DIAGNOSIS — I5021 Acute systolic (congestive) heart failure: Secondary | ICD-10-CM | POA: Insufficient documentation

## 2021-05-14 DIAGNOSIS — Z91128 Patient's intentional underdosing of medication regimen for other reason: Secondary | ICD-10-CM

## 2021-05-14 DIAGNOSIS — I110 Hypertensive heart disease with heart failure: Principal | ICD-10-CM | POA: Diagnosis present

## 2021-05-14 DIAGNOSIS — R778 Other specified abnormalities of plasma proteins: Secondary | ICD-10-CM

## 2021-05-14 DIAGNOSIS — Z91148 Patient's other noncompliance with medication regimen for other reason: Secondary | ICD-10-CM

## 2021-05-14 DIAGNOSIS — Z9114 Patient's other noncompliance with medication regimen: Secondary | ICD-10-CM

## 2021-05-14 DIAGNOSIS — E785 Hyperlipidemia, unspecified: Secondary | ICD-10-CM | POA: Diagnosis present

## 2021-05-14 DIAGNOSIS — T447X6A Underdosing of beta-adrenoreceptor antagonists, initial encounter: Secondary | ICD-10-CM | POA: Diagnosis present

## 2021-05-14 DIAGNOSIS — Z79899 Other long term (current) drug therapy: Secondary | ICD-10-CM

## 2021-05-14 DIAGNOSIS — Z841 Family history of disorders of kidney and ureter: Secondary | ICD-10-CM

## 2021-05-14 DIAGNOSIS — Z803 Family history of malignant neoplasm of breast: Secondary | ICD-10-CM

## 2021-05-14 DIAGNOSIS — T502X6A Underdosing of carbonic-anhydrase inhibitors, benzothiadiazides and other diuretics, initial encounter: Secondary | ICD-10-CM | POA: Diagnosis present

## 2021-05-14 DIAGNOSIS — Z808 Family history of malignant neoplasm of other organs or systems: Secondary | ICD-10-CM

## 2021-05-14 DIAGNOSIS — R7989 Other specified abnormal findings of blood chemistry: Secondary | ICD-10-CM | POA: Diagnosis present

## 2021-05-14 DIAGNOSIS — I248 Other forms of acute ischemic heart disease: Secondary | ICD-10-CM | POA: Diagnosis present

## 2021-05-14 DIAGNOSIS — E66813 Obesity, class 3: Secondary | ICD-10-CM | POA: Diagnosis present

## 2021-05-14 DIAGNOSIS — E876 Hypokalemia: Secondary | ICD-10-CM | POA: Diagnosis present

## 2021-05-14 DIAGNOSIS — Z8249 Family history of ischemic heart disease and other diseases of the circulatory system: Secondary | ICD-10-CM

## 2021-05-14 DIAGNOSIS — Z20822 Contact with and (suspected) exposure to covid-19: Secondary | ICD-10-CM | POA: Diagnosis present

## 2021-05-14 DIAGNOSIS — I509 Heart failure, unspecified: Principal | ICD-10-CM

## 2021-05-14 DIAGNOSIS — Z6841 Body Mass Index (BMI) 40.0 and over, adult: Secondary | ICD-10-CM

## 2021-05-14 DIAGNOSIS — I160 Hypertensive urgency: Secondary | ICD-10-CM | POA: Diagnosis present

## 2021-05-14 LAB — CBC WITH DIFFERENTIAL/PLATELET
Abs Immature Granulocytes: 0.03 10*3/uL (ref 0.00–0.07)
Basophils Absolute: 0 10*3/uL (ref 0.0–0.1)
Basophils Relative: 0 %
Eosinophils Absolute: 0.1 10*3/uL (ref 0.0–0.5)
Eosinophils Relative: 2 %
HCT: 42.2 % (ref 39.0–52.0)
Hemoglobin: 13.4 g/dL (ref 13.0–17.0)
Immature Granulocytes: 0 %
Lymphocytes Relative: 24 %
Lymphs Abs: 2.1 10*3/uL (ref 0.7–4.0)
MCH: 28 pg (ref 26.0–34.0)
MCHC: 31.8 g/dL (ref 30.0–36.0)
MCV: 88.1 fL (ref 80.0–100.0)
Monocytes Absolute: 0.4 10*3/uL (ref 0.1–1.0)
Monocytes Relative: 5 %
Neutro Abs: 5.9 10*3/uL (ref 1.7–7.7)
Neutrophils Relative %: 69 %
Platelets: 263 10*3/uL (ref 150–400)
RBC: 4.79 MIL/uL (ref 4.22–5.81)
RDW: 13 % (ref 11.5–15.5)
WBC: 8.6 10*3/uL (ref 4.0–10.5)
nRBC: 0 % (ref 0.0–0.2)

## 2021-05-14 LAB — RESP PANEL BY RT-PCR (FLU A&B, COVID) ARPGX2
Influenza A by PCR: NEGATIVE
Influenza B by PCR: NEGATIVE
SARS Coronavirus 2 by RT PCR: NEGATIVE

## 2021-05-14 LAB — COMPREHENSIVE METABOLIC PANEL
ALT: 22 U/L (ref 0–44)
AST: 25 U/L (ref 15–41)
Albumin: 3.8 g/dL (ref 3.5–5.0)
Alkaline Phosphatase: 84 U/L (ref 38–126)
Anion gap: 6 (ref 5–15)
BUN: 20 mg/dL (ref 6–20)
CO2: 27 mmol/L (ref 22–32)
Calcium: 9 mg/dL (ref 8.9–10.3)
Chloride: 105 mmol/L (ref 98–111)
Creatinine, Ser: 1.19 mg/dL (ref 0.61–1.24)
GFR, Estimated: >60 mL/min (ref >60)
Glucose, Bld: 119 mg/dL — ABNORMAL HIGH (ref 70–99)
Potassium: 3.3 mmol/L — ABNORMAL LOW (ref 3.5–5.1)
Sodium: 138 mmol/L (ref 135–145)
Total Bilirubin: 0.9 mg/dL (ref 0.3–1.2)
Total Protein: 7.2 g/dL (ref 6.5–8.1)

## 2021-05-14 LAB — TROPONIN I (HIGH SENSITIVITY)
Troponin I (High Sensitivity): 73 ng/L — ABNORMAL HIGH (ref <18)
Troponin I (High Sensitivity): 82 ng/L — ABNORMAL HIGH (ref <18)
Troponin I (High Sensitivity): 71 ng/L — ABNORMAL HIGH (ref <18)
Troponin I (High Sensitivity): 70 ng/L — ABNORMAL HIGH (ref <18)

## 2021-05-14 LAB — BRAIN NATRIURETIC PEPTIDE: B Natriuretic Peptide: 286.7 pg/mL — ABNORMAL HIGH (ref 0.0–100.0)

## 2021-05-14 LAB — MAGNESIUM: Magnesium: 2.1 mg/dL (ref 1.7–2.4)

## 2021-05-14 MED ORDER — ASPIRIN EC 81 MG PO TBEC: 81.0000 mg | DELAYED_RELEASE_TABLET | Freq: Every day | ORAL | Status: DC

## 2021-05-14 MED ORDER — POTASSIUM CHLORIDE 10 MEQ/100ML IV SOLN
10.0000 meq | INTRAVENOUS | Status: AC
  Administered 2021-05-14 (×2): 10 meq via INTRAVENOUS
  Filled 2021-05-14 (×2): qty 100

## 2021-05-14 MED ORDER — ONDANSETRON HCL 4 MG/2ML IJ SOLN: 4.0000 mg | Freq: Four times a day (QID) | INTRAMUSCULAR | Status: DC | PRN

## 2021-05-14 MED ORDER — FUROSEMIDE 10 MG/ML IJ SOLN
40.0000 mg | Freq: Two times a day (BID) | INTRAMUSCULAR | Status: DC
  Administered 2021-05-14 – 2021-05-16 (×4): 40 mg via INTRAVENOUS
  Filled 2021-05-14 (×4): qty 4

## 2021-05-14 MED ORDER — FLUTICASONE PROPIONATE 50 MCG/ACT NA SUSP
1.0000 | Freq: Two times a day (BID) | NASAL | Status: DC | PRN
  Filled 2021-05-14: qty 16

## 2021-05-14 MED ORDER — ACETAMINOPHEN 325 MG PO TABS
650.0000 mg | ORAL_TABLET | Freq: Four times a day (QID) | ORAL | Status: DC | PRN
  Administered 2021-05-15 (×3): 650 mg via ORAL
  Filled 2021-05-14 (×3): qty 2

## 2021-05-14 MED ORDER — ASPIRIN EC 325 MG PO TBEC
325.0000 mg | DELAYED_RELEASE_TABLET | Freq: Once | ORAL | Status: AC
  Administered 2021-05-14: 325 mg via ORAL
  Filled 2021-05-14: qty 1

## 2021-05-14 MED ORDER — IRBESARTAN 150 MG PO TABS
300.0000 mg | ORAL_TABLET | Freq: Every day | ORAL | Status: DC
Start: 2021-05-14 — End: 2021-05-16
  Administered 2021-05-14 – 2021-05-16 (×3): 300 mg via ORAL
  Filled 2021-05-14 (×3): qty 2

## 2021-05-14 MED ORDER — SODIUM CHLORIDE 0.9% FLUSH
3.0000 mL | INTRAVENOUS | Status: DC | PRN
Start: 2021-05-14 — End: 2021-05-16

## 2021-05-14 MED ORDER — POTASSIUM CHLORIDE CRYS ER 20 MEQ PO TBCR
40.0000 meq | EXTENDED_RELEASE_TABLET | Freq: Once | ORAL | Status: AC
  Administered 2021-05-14: 40 meq via ORAL
  Filled 2021-05-14: qty 2

## 2021-05-14 MED ORDER — AMLODIPINE BESYLATE 10 MG PO TABS
10.0000 mg | ORAL_TABLET | Freq: Every day | ORAL | Status: DC
Start: 2021-05-14 — End: 2021-05-16
  Administered 2021-05-14 – 2021-05-16 (×3): 10 mg via ORAL
  Filled 2021-05-14 (×2): qty 2
  Filled 2021-05-14: qty 1

## 2021-05-14 MED ORDER — HYDRALAZINE HCL 20 MG/ML IJ SOLN
10.0000 mg | Freq: Once | INTRAMUSCULAR | Status: AC
  Administered 2021-05-14: 10 mg via INTRAVENOUS
  Filled 2021-05-14: qty 1

## 2021-05-14 MED ORDER — SODIUM CHLORIDE 0.9 % IV SOLN: 250.0000 mL | INTRAVENOUS | Status: DC | PRN

## 2021-05-14 MED ORDER — ATORVASTATIN CALCIUM 10 MG PO TABS
10.0000 mg | ORAL_TABLET | Freq: Every day | ORAL | Status: DC
  Administered 2021-05-14 – 2021-05-15 (×2): 10 mg via ORAL
  Filled 2021-05-14 (×2): qty 1

## 2021-05-14 MED ORDER — ONDANSETRON HCL 4 MG PO TABS: 4.0000 mg | ORAL_TABLET | Freq: Four times a day (QID) | ORAL | Status: DC | PRN

## 2021-05-14 MED ORDER — POTASSIUM CHLORIDE CRYS ER 20 MEQ PO TBCR
40.0000 meq | EXTENDED_RELEASE_TABLET | Freq: Every day | ORAL | Status: DC
  Administered 2021-05-15 – 2021-05-16 (×2): 40 meq via ORAL
  Filled 2021-05-14 (×2): qty 2

## 2021-05-14 MED ORDER — NITROGLYCERIN 2 % TD OINT
1.0000 [in_us] | TOPICAL_OINTMENT | Freq: Once | TRANSDERMAL | Status: AC
  Administered 2021-05-14: 1 [in_us] via TOPICAL
  Filled 2021-05-14: qty 1

## 2021-05-14 MED ORDER — SODIUM CHLORIDE 0.9% FLUSH
3.0000 mL | Freq: Two times a day (BID) | INTRAVENOUS | Status: DC
  Administered 2021-05-14 – 2021-05-16 (×5): 3 mL via INTRAVENOUS

## 2021-05-14 MED ORDER — ACETAMINOPHEN 650 MG RE SUPP: 650.0000 mg | Freq: Four times a day (QID) | RECTAL | Status: DC | PRN

## 2021-05-14 MED ORDER — MELATONIN 5 MG PO TABS
5.0000 mg | ORAL_TABLET | Freq: Every evening | ORAL | Status: DC | PRN
  Administered 2021-05-14 – 2021-05-15 (×2): 5 mg via ORAL
  Filled 2021-05-14 (×2): qty 1

## 2021-05-14 MED ORDER — CYCLOBENZAPRINE HCL 10 MG PO TABS: 10.0000 mg | ORAL_TABLET | Freq: Three times a day (TID) | ORAL | Status: DC | PRN

## 2021-05-14 MED ORDER — METOPROLOL SUCCINATE ER 50 MG PO TB24
50.0000 mg | ORAL_TABLET | Freq: Every day | ORAL | Status: DC
  Administered 2021-05-14 – 2021-05-16 (×3): 50 mg via ORAL
  Filled 2021-05-14 (×3): qty 1

## 2021-05-14 MED ORDER — ASPIRIN EC 81 MG PO TBEC
81.0000 mg | DELAYED_RELEASE_TABLET | Freq: Every day | ORAL | Status: DC
  Administered 2021-05-15 – 2021-05-16 (×2): 81 mg via ORAL
  Filled 2021-05-14 (×3): qty 1

## 2021-05-14 MED ORDER — ENOXAPARIN SODIUM 80 MG/0.8ML IJ SOSY
0.5000 mg/kg | PREFILLED_SYRINGE | INTRAMUSCULAR | Status: DC
  Administered 2021-05-14 – 2021-05-15 (×2): 80 mg via SUBCUTANEOUS
  Filled 2021-05-14 (×2): qty 0.8

## 2021-05-14 MED ORDER — FUROSEMIDE 10 MG/ML IJ SOLN
60.0000 mg | Freq: Once | INTRAMUSCULAR | Status: AC
  Administered 2021-05-14: 60 mg via INTRAVENOUS
  Filled 2021-05-14: qty 8

## 2021-05-14 MED ORDER — LOSARTAN POTASSIUM 50 MG PO TABS
50.0000 mg | ORAL_TABLET | Freq: Every day | ORAL | Status: DC
  Administered 2021-05-14: 50 mg via ORAL
  Filled 2021-05-14: qty 1

## 2021-05-14 NOTE — ED Notes (Signed)
Pt given breakfast tray

## 2021-05-14 NOTE — ED Notes (Signed)
Pt expresses his concerns of still being in the ED, this RN will request a hospital bed for comfort.  ?

## 2021-05-14 NOTE — ED Notes (Signed)
Dr. Lucky Rathke at bedside ?

## 2021-05-14 NOTE — ED Notes (Signed)
Pt requests to walk around room, pt educated on need for cardiac monitoring but pt asks to please walk around the room "because I cant sit still".  ? ?Pt placed in hospital bed and educated on use of bed controls. ? ?Dinner tray given to pt.  ?

## 2021-05-14 NOTE — ED Notes (Signed)
Pt to radiology at this time via stretcher ?

## 2021-05-14 NOTE — Assessment & Plan Note (Addendum)
Estimated body mass index is 43.75 kg/m? as calculated from the following: ?  Height as of this encounter: 6\' 3"  (1.905 m). ?  Weight as of this encounter: 158.8 kg. ? ?

## 2021-05-14 NOTE — Assessment & Plan Note (Addendum)
This is a new diagnosis for the patient.  Echocardiogram shows EF to be 45 to 50%.  Patient was seen by cardiology.  Poorly controlled blood pressure is thought to be the reason for his cardiac dysfunction.  Patient was diuresed.  He is feeling better.  Blood pressure medications were adjusted.  Discussed with cardiology today.  Patient really wants to go home.  Okay for discharge current medications.  Cardiology will arrange outpatient follow-up.   ?

## 2021-05-14 NOTE — ED Notes (Signed)
Randa Spike, NP notified via secure chat of BP of 179/127 (left arm) 167/121 (right arm) after IV hydralazine.  ?

## 2021-05-14 NOTE — Consult Note (Addendum)
?Cardiology Consultation:  ? ?Patient ID: John Lowe ?MRN: [id number]; DOB: 07/16/1980 ? ?Admit date: 05/14/2021 ?Date of Consult: 05/14/2021 ? ?Primary Care Provider: Patient, No Pcp Per (Inactive) ?[facility] Cardiologist: None NEW ?[facility] Electrophysiologist:  None  ? ? ?Patient Profile:  ? ?John FlemingsJermaine Lowe is a 41 y.o. male with a hx of hypertension, morbid obesity, medical noncompliance, hyperlipidemia and hypertension who is being seen today for the evaluation of hypertensive urgency and new onset CHF at the request of Lucile Shuttersochukwu Agbata, MD. ? ?History of Present Illness:  ? ?Mr. John Lowe is a 41 year old African-American male with a history of hyperlipidemia, hypertension, obesity, medical noncompliance who was in his usual state of health until about 3 weeks ago when he started developing shortness of breath, PND, orthopnea and wheezing.  He also was having some lower extremity edema and cough but no chest pain or pressure.  He has been noncompliant with his medications and had not taken any of his antihypertensive medications in a month.  He called EMS due to shortness of breath and wheezing and on admission to the ER chest x-ray showed pulmonary vascular congestion and marked elevation in blood pressure at 193/15610mmHg.   2D echo was ordered and is pending.  He was started on Lasix 40 mg IV 12 hours.  Labs showed a potassium of 3.3, serum creatinine 1.19, normal LFTs and elevated BNP at 286.  Twelve-lead EKG showed normal sinus rhythm with no ST changes.  High-sensitivity troponin was mildly elevated and flat at 73>> 82>> 71.  Cardiology is now asked to consult for work-up of new onset CHF. ? ? ?Past Medical History:  ?Diagnosis Date  ? Hyperlipidemia   ? Hypertension   ? Obesity, Class III, BMI 40-49.9 (morbid obesity) (HCC)   ? ? ?Past Surgical History:  ?Procedure Laterality Date  ? ANKLE SURGERY    ? rotator cuff Right   ?  ? ?Home Medications:  ?Prior to Admission medications   ?Medication  Sig Start Date End Date Taking? Authorizing Provider  ?aspirin 81 MG tablet Take 81 mg by mouth daily.   Yes [provider]  ?fluticasone (FLONASE) 50 MCG/ACT nasal spray Place 1 spray into both nostrils 2 (two) times daily. 04/01/17  Yes Lada, Janit BernMelinda P, MD  ?Olmesartan-amLODIPine-HCTZ 40-10-25 MG TABS Take 1 tablet by mouth daily. 03/21/20  Yes Bast, Jarmel Linhardt A, NP  ?atorvastatin (LIPITOR) 10 MG tablet Take 1 tablet (10 mg total) by mouth at bedtime. ?Patient not taking: Reported on 05/14/2021 03/12/18   Kerman PasseyLada, Melinda P, MD  ?cyclobenzaprine (FLEXERIL) 10 MG tablet Take 1 tablet (10 mg total) by mouth 3 (three) times daily as needed. ?Patient not taking: Reported on 05/14/2021 09/17/19   Joni ReiningSmith, Ronald K, PA-C  ?meloxicam (MOBIC) 15 MG tablet Take 1 tablet (15 mg total) by mouth daily. ?Patient not taking: Reported on 05/14/2021 03/21/20   Dahlia ByesBast, Tangy Drozdowski A, NP  ?metoprolol succinate (TOPROL-XL) 50 MG 24 hr tablet TAKE 1 TABLET BY MOUTH ONCE DAILY (LAST  REFILL,  NEEDS  FOLLOW  UP) ?Patient not taking: Reported on 05/14/2021 09/17/19   Joni ReiningSmith, Ronald K, PA-C  ? ? ?Inpatient Medications: ?Scheduled Meds: ? [START ON 05/15/2021] aspirin EC  81 mg Oral Daily  ? atorvastatin  10 mg Oral QHS  ? enoxaparin (LOVENOX) injection  0.5 mg/kg Subcutaneous Q24H  ? furosemide  40 mg Intravenous Q12H  ? losartan  50 mg Oral Daily  ? metoprolol succinate  50 mg Oral Daily  ? [START ON 05/15/2021]  potassium chloride  40 mEq Oral Daily  ? sodium chloride flush  3 mL Intravenous Q12H  ? ?Continuous Infusions: ? sodium chloride    ? ?PRN Meds: ?sodium chloride, acetaminophen **OR** acetaminophen, cyclobenzaprine, fluticasone, ondansetron **OR** ondansetron (ZOFRAN) IV, sodium chloride flush ? ?Allergies:   No Known Allergies ? ?Social History:   ?Social History  ? ?Socioeconomic History  ? Marital status: Married  ?  Spouse name: Not on file  ? Number of children: Not on file  ? Years of education: Not on file  ? Highest education level: Not on  file  ?Occupational History  ? Not on file  ?Tobacco Use  ? Smoking status: Never  ? Smokeless tobacco: Never  ?Vaping Use  ? Vaping Use: Never used  ?Substance and Sexual Activity  ? Alcohol use: No  ? Drug use: No  ? Sexual activity: Not Currently  ?Other Topics Concern  ? Not on file  ?Social History Narrative  ? Not on file  ? ?Social Determinants of Health  ? ?Financial Resource Strain: Not on file  ?Food Insecurity: Not on file  ?Transportation Needs: Not on file  ?Physical Activity: Not on file  ?Stress: Not on file  ?Social Connections: Not on file  ?Intimate Partner Violence: Not on file  ?  ?Family History:   ? ?Family History  ?Problem Relation Age of Onset  ? Hypertension Mother   ? Hypertension Father   ? Heart disease Father   ? Kidney disease Father   ? Hypertension Sister   ? Hypertension Brother   ? Cancer Maternal Grandmother   ?     breast  ? Cancer Maternal Grandfather   ?     bone cancer  ? Hypertension Paternal Grandmother   ?  ? ?ROS:  ?Please see the history of present illness.  ? ?All other ROS reviewed and negative.    ? ?Physical Exam/Data:  ? ?Vitals:  ? 05/14/21 0500 05/14/21 0530 05/14/21 0800 05/14/21 0830  ?BP: (!) 166/124 (!) 159/109 (!) 193/110 (!) 167/123  ?Pulse: 78 76 75 79  ?Resp:  20 20 20   ?Temp:      ?TempSrc:      ?SpO2: 94% 97% 95% 98%  ?Weight:      ?Height:      ? ? ?Intake/Output Summary (Last 24 hours) at 05/14/2021 1102 ?Last data filed at 05/14/2021 0951 ?Gross per 24 hour  ?Intake --  ?Output 550 ml  ?Net -550 ml  ? ?Last 3 Weights 05/14/2021 09/17/2019 09/17/2019  ?Weight (lbs) 350 lb 358 lb 358 lb  ?Weight (kg) 158.759 kg 162.388 kg 162.388 kg  ?   ?Body mass index is 43.75 kg/m?.  ?General:  Well nourished, well developed, in no acute distress ?HEENT: normal ?Lymph: no adenopathy ?Neck: no JVD ?Endocrine:  No thryomegaly ?Vascular: No carotid bruits; FA pulses 2+ bilaterally without bruits  ?Cardiac:  normal S1, S2; RRR; no murmur  ?Lungs:  clear to auscultation  bilaterally, no wheezing, rhonchi or rales  ?Abd: soft, nontender, no hepatomegaly  ?Ext: no edema ?Musculoskeletal:  No deformities, BUE and BLE strength normal and equal ?Skin: warm and dry  ?Neuro:  CNs 2-12 intact, no focal abnormalities noted ?Psych:  Normal affect  ? ?EKG:  The EKG was personally reviewed and demonstrates: Normal sinus rhythm with no ST changes ?Telemetry:  Telemetry was personally reviewed and demonstrates: Normal sinus rhythm ? ?Relevant CV Studies: ?2D echo pending ? ?Laboratory Data: ? ?High Sensitivity Troponin:   ?  Recent Labs  ?Lab 05/14/21 ?0438 05/14/21 ?0629 05/14/21 ?0857  ?TROPONINIHS 73* 82* 71*  ?   ?Chemistry ?Recent Labs  ?Lab 05/14/21 ?0438  ?NA 138  ?K 3.3*  ?CL 105  ?CO2 27  ?GLUCOSE 119*  ?BUN 20  ?CREATININE 1.19  ?CALCIUM 9.0  ?GFRNONAA >60  ?ANIONGAP 6  ?  ?Recent Labs  ?Lab 05/14/21 ?0438  ?PROT 7.2  ?ALBUMIN 3.8  ?AST 25  ?ALT 22  ?ALKPHOS 84  ?BILITOT 0.9  ? ?Hematology ?Recent Labs  ?Lab 05/14/21 ?0438  ?WBC 8.6  ?RBC 4.79  ?HGB 13.4  ?HCT 42.2  ?MCV 88.1  ?MCH 28.0  ?MCHC 31.8  ?RDW 13.0  ?PLT 263  ? ?BNP ?Recent Labs  ?Lab 05/14/21 ?0438  ?BNP 286.7*  ?  ?DDimer No results for input(s): DDIMER in the last 168 hours. ? ? ?Radiology/Studies:  ?DG Chest 2 View ? ?Result Date: 05/14/2021 ?CLINICAL DATA:  Dyspnea.  Evaluate for pulmonary congestion. EXAM: CHEST - 2 VIEW COMPARISON:  05/09/2012 FINDINGS: Mild cardiac enlargement. Low lung volumes. Diffuse pulmonary vascular congestion. No pleural effusion. No airspace consolidation. Visualized osseous structures are unremarkable. IMPRESSION: Cardiac enlargement and pulmonary vascular congestion. Electronically Signed   By: Signa Kell M.D.   On: 05/14/2021 05:25   ? ? ?Assessment and Plan:  ? ?New onset acute congestive heart failure ?-2D echo pending to assess LV function and diastolic ?-Has been exacerbated by medical noncompliance and hypertensive urgency on admission ?-He was started on Lasix 40 mg IV twice daily  this morning and is put out 500 cc thus far ?-await results of 2D echocardiogram and if LV function is reduced will need GDMT for heart failure ?-Continue Lasix 40 mg IV twice daily and follow strict I's and

## 2021-05-14 NOTE — ED Provider Notes (Signed)
? ?[facility] ?Provider Note ? ? ? Event Date/Time  ? First MD Initiated Contact with Patient 05/14/21 0425   ?  (approximate) ? ? ?History  ? ?Shortness of Breath ? ? ?HPI ? ?John Lowe is a 41 y.o. male who presents to the ED for evaluation of Shortness of Breath ?  ?History of hypertension on multiple agents and morbid obesity. ? ?Patient presents to the ED for evaluation of 2-3 days of worsening shortness of breath and orthopnea.  Reports associated increased lower extremity swelling.  Reports cough, occasionally productive of thin sputum.  Denies chest pain, chest pressure, fever, syncope, abdominal pain or emesis.  Reports severe orthopnea this morning that his wife called 911.  He feels better sitting up. ? ?Physical Exam  ? ?Triage Vital Signs: ?ED Triage Vitals  ?Enc Vitals Group  ?   BP   ?   Pulse   ?   Resp   ?   Temp   ?   Temp src   ?   SpO2   ?   Weight   ?   Height   ?   Head Circumference   ?   Peak Flow   ?   Pain Score   ?   Pain Loc   ?   Pain Edu?   ?   Excl. in GC?   ? ? ?Most recent vital signs: ?Vitals:  ? 05/14/21 0500 05/14/21 0530  ?BP: (!) 166/124 (!) 159/109  ?Pulse: 78 76  ?Resp:    ?Temp:    ?SpO2: 94% 93%  ? ? ?General: Awake, no distress.  Morbidly obese, pleasant and conversational.  On room air. ?CV:  Good peripheral perfusion. RRR ?Resp:  Normal effort.  Faint bibasilar crackles, otherwise clear.  No wheezing. ?Abd:  No distention.  Soft and benign ?MSK:  No deformity noted.  Trace pitting edema to bilateral lower extremities symmetrically without overlying skin changes or signs of trauma. ?Neuro:  No focal deficits appreciated. ?Other:   ? ? ?ED Results / Procedures / Treatments  ? ?Labs ?(all labs ordered are listed, but only abnormal results are displayed) ?Labs Reviewed  ?COMPREHENSIVE METABOLIC PANEL - Abnormal; Notable for the following components:  ?    Result Value  ? Potassium 3.3 (*)   ? Glucose, Bld 119 (*)   ? All other components within  normal limits  ?BRAIN NATRIURETIC PEPTIDE - Abnormal; Notable for the following components:  ? B Natriuretic Peptide 286.7 (*)   ? All other components within normal limits  ?TROPONIN I (HIGH SENSITIVITY) - Abnormal; Notable for the following components:  ? Troponin I (High Sensitivity) 73 (*)   ? All other components within normal limits  ?RESP PANEL BY RT-PCR (FLU A&B, COVID) ARPGX2  ?CBC WITH DIFFERENTIAL/PLATELET  ? ? ?EKG ?Sinus rhythm, rate of 85 bpm.  Normal axis and intervals.  No evidence of acute ischemia. ? ?RADIOLOGY ?2 view CXR reviewed by me with cardiomegaly and pulm vascular congestion without pleural effusions or infiltrate. ? ?Official radiology report(s): ?DG Chest 2 View ? ?Result Date: 05/14/2021 ?CLINICAL DATA:  Dyspnea.  Evaluate for pulmonary congestion. EXAM: CHEST - 2 VIEW COMPARISON:  05/09/2012 FINDINGS: Mild cardiac enlargement. Low lung volumes. Diffuse pulmonary vascular congestion. No pleural effusion. No airspace consolidation. Visualized osseous structures are unremarkable. IMPRESSION: Cardiac enlargement and pulmonary vascular congestion. Electronically Signed   By: Signa Kell M.D.   On: 05/14/2021 05:25   ? ?PROCEDURES and INTERVENTIONS: ? ?.  1-3 Lead EKG Interpretation ?Performed by: Delton Prairie, MD ?Authorized by: Delton Prairie, MD  ? ?  Interpretation: normal   ?  ECG rate:  80 ?  ECG rate assessment: normal   ?  Rhythm: sinus rhythm   ?  Ectopy: none   ?  Conduction: normal   ? ?Medications  ?potassium chloride 10 mEq in 100 mL IVPB (10 mEq Intravenous New Bag/Given 05/14/21 0551)  ?potassium chloride SA (KLOR-CON M) CR tablet 40 mEq (40 mEq Oral Given 05/14/21 0545)  ?furosemide (LASIX) injection 60 mg (60 mg Intravenous Given 05/14/21 0545)  ?aspirin EC tablet 325 mg (325 mg Oral Given 05/14/21 0544)  ? ? ? ?IMPRESSION / MDM / ASSESSMENT AND PLAN / ED COURSE  ?I reviewed the triage vital signs and the nursing notes. ? ?Obese patient with history of hypertension presents to the  ED with evidence of new onset CHF.  He is on room air and remains hemodynamically stable, symptomatic.  Prefers to sit upright due to his orthopnea.  Evidence of volume overload on examination without distress.  Blood work with mild hypokalemia that is repleted orally and IV.  Normal CBC.  CXR with congestion suggestive of CHF, but no discrete infiltrate.  First troponin is moderately elevated to the 70s without any comparison, and his EKG is nonischemic.  He has no chest pain I doubt ACS.  Aspirin provided in the ED.  His BNP is elevated and overall syndrome is consistent with new onset CHF.  We will initiate diuresis here in the ED and consult with medicine for admission. ? ?Clinical Course as of 05/14/21 0600  ?Wynelle Link May 14, 2021  ?0530 Reassessed the patient and expressed my concerns for new onset CHF.  We discussed initiating diuretics, potassium repletion and medical observation admission considering symptomatic nature and  slightly elevated troponins.  He is agreeable. [DS]  ?  ?Clinical Course User Index ?[DS] Delton Prairie, MD  ? ? ? ?FINAL CLINICAL IMPRESSION(S) / ED DIAGNOSES  ? ?Final diagnoses:  ?New onset of congestive heart failure (HCC)  ? ? ? ?Rx / DC Orders  ? ?ED Discharge Orders   ? ? None  ? ?  ? ? ? ?Note:  This document was prepared using Dragon voice recognition software and may include unintentional dictation errors. ?  ?Delton Prairie, MD ?05/14/21 0601 ? ?

## 2021-05-14 NOTE — H&P (Addendum)
?History and Physical  ? ? ?Patient: John Lowe YYT:[id number] DOB: 08/30/1980 ?DOA: 05/14/2021 ?DOS: the patient was seen and examined on 05/14/2021 ?PCP: Patient, No Pcp Per (Inactive)  ?Patient coming from: Home ? ?Chief Complaint:  ?Chief Complaint  ?Patient presents with  ? Shortness of Breath  ? ?HPI: John Lowe is a 41 y.o. male with medical history significant for hypertension, morbid obesity and medication noncompliance who presents to the emergency room for evaluation of a 3-week history of worsening shortness of breath mostly at night, orthopnea and on the day of admission wheezing. ?Patient states that he has had symptoms for about 3 weeks and is usually short of breath mostly at night when he is laying down but on the day of admission he developed wheezing and called EMS. ?He has noted some lower extremity swelling and has a cough productive of thin phlegm.  He denies having any chest pain, no nausea, no vomiting, no diaphoresis, no palpitations, no changes in his bowel habits, no dizziness or lightheadedness. ?He is on multiple antihypertensive medications and has not taken them in a month. ?Chest x-ray showed cardiomegaly with pulmonary vascular congestion and patient's blood pressure significantly elevated. ?He will be admitted to the hospital for further evaluation. ?Review of Systems: As mentioned in the history of present illness. All other systems reviewed and are negative. ?Past Medical History:  ?Diagnosis Date  ? Hyperlipidemia   ? Hypertension   ? Obesity, Class III, BMI 40-49.9 (morbid obesity) (HCC)   ? ?Past Surgical History:  ?Procedure Laterality Date  ? ANKLE SURGERY    ? rotator cuff Right   ? ?Social History:  reports that he has never smoked. He has never used smokeless tobacco. He reports that he does not drink alcohol and does not use drugs. ? ?No Known Allergies ? ?Family History  ?Problem Relation Age of Onset  ? Hypertension Mother   ? Hypertension Father   ? Heart  disease Father   ? Kidney disease Father   ? Hypertension Sister   ? Hypertension Brother   ? Cancer Maternal Grandmother   ?     breast  ? Cancer Maternal Grandfather   ?     bone cancer  ? Hypertension Paternal Grandmother   ? ? ?Prior to Admission medications   ?Medication Sig Start Date End Date Taking? Authorizing Provider  ?aspirin 81 MG tablet Take 81 mg by mouth daily.    [provider]  ?atorvastatin (LIPITOR) 10 MG tablet Take 1 tablet (10 mg total) by mouth at bedtime. 03/12/18   Kerman Passey, MD  ?cyclobenzaprine (FLEXERIL) 10 MG tablet Take 1 tablet (10 mg total) by mouth 3 (three) times daily as needed. 09/17/19   Joni Reining, PA-C  ?fluticasone (FLONASE) 50 MCG/ACT nasal spray Place 1 spray into both nostrils 2 (two) times daily. 04/01/17   Kerman Passey, MD  ?meloxicam (MOBIC) 15 MG tablet Take 1 tablet (15 mg total) by mouth daily. 03/21/20   Dahlia Byes A, NP  ?metoprolol succinate (TOPROL-XL) 50 MG 24 hr tablet TAKE 1 TABLET BY MOUTH ONCE DAILY (LAST  REFILL,  NEEDS  FOLLOW  UP) 09/17/19   Joni Reining, PA-C  ?Olmesartan-amLODIPine-HCTZ 40-10-25 MG TABS Take 1 tablet by mouth daily. 03/21/20   Janace Aris, NP  ? ? ?Physical Exam: ?Vitals:  ? 05/14/21 0500 05/14/21 0530 05/14/21 0800 05/14/21 0830  ?BP: (!) 166/124 (!) 159/109 (!) 193/110 (!) 167/123  ?Pulse: 78 76 75 79  ?  Resp:  20 20 20   ?Temp:      ?TempSrc:      ?SpO2: 94% 97% 95% 98%  ?Weight:      ?Height:      ? ?Physical Exam ?Vitals and nursing note reviewed.  ?Constitutional:   ?   Appearance: He is well-developed. He is obese.  ?HENT:  ?   Head: Normocephalic and atraumatic.  ?   Mouth/Throat:  ?   Mouth: Mucous membranes are moist.  ?Eyes:  ?   Pupils: Pupils are equal, round, and reactive to light.  ?Cardiovascular:  ?   Rate and Rhythm: Normal rate and regular rhythm.  ?Pulmonary:  ?   Effort: Pulmonary effort is normal.  ?   Breath sounds: Examination of the right-lower field reveals decreased breath sounds and  rales. Examination of the left-lower field reveals decreased breath sounds and rales. Decreased breath sounds and rales present.  ?Musculoskeletal:  ?   Cervical back: Normal range of motion and neck supple.  ?   Right lower leg: Edema present.  ?   Left lower leg: Edema present.  ?Skin: ?   General: Skin is warm and dry.  ?Neurological:  ?   General: No focal deficit present.  ?   Mental Status: He is alert and oriented to person, place, and time.  ?Psychiatric:     ?   Mood and Affect: Mood normal.     ?   Behavior: Behavior normal.  ? ? ?Data Reviewed: ?Relevant notes from primary care and specialist visits, past discharge summaries as available in EHR, including Care Everywhere. ?Prior diagnostic testing as pertinent to current admission diagnoses ?Updated medications and problem lists for reconciliation ?ED course, including vitals, labs, imaging, treatment and response to treatment ?Triage notes, nursing and pharmacy notes and ED provider's notes ?Notable results as noted in HPI ?Labs reviewed.  Troponin 73 >> 82.  BNP 286, potassium 3.3, glucose 119 ?Chest x-ray reviewed by me shows cardiomegaly with pulmonary vascular congestion ?Twelve-lead EKG reviewed by me shows sinus rhythm with old anteroseptal infarct. ?There are no new results to review at this time. ? ?Assessment and Plan: ?* New onset of congestive heart failure (HCC) ?Patient presents to the ER for evaluation of shortness of breath, orthopnea and bilateral lower extremity swelling. ?Chest x-ray shows cardiomegaly with pulmonary vascular congestion and BNP is elevated ?Unclear etiology of CHF at this time ?We will place patient on Lasix 40 mg IV every 12 ?Place patient on metoprolol and losartan to optimize blood pressure control ?Obtain 2D echocardiogram to assess LVEF ?We will request cardiology consult ? ?Hypertensive urgency ?Patient with a history of hypertension who has been noncompliant with prescribed medications for over a month and  presents to the ER for evaluation of shortness of breath, orthopnea and lower extremity swelling. ?Blood pressure noted to be significantly elevated with systolic blood pressure of 190 ?Place patient on Nitropaste ?Will be seen metoprolol and losartan ?We will uptitrate medications as needed to optimize blood pressure control ? ?Elevated troponin ?Most likely secondary to demand ischemia from acute CHF ?Troponin is up trending ?We will monitor serial cardiac enzymes ?Continue aspirin, beta-blocker and statin ? ?Hypokalemia ?Supplement potassium ?Check magnesium levels ? ?Non compliance w medication regimen ?Patient admits to being noncompliant with prescribed medications and does not have a PCP ?Will refer to [facility] upon discharge so the patient can establish care with a primary care provider ? ?Obesity, Class III, BMI 40-49.9 (morbid obesity) (HCC) ?Complicates overall prognosis  and care ?Lifestyle modification and exercise has been discussed with patient in detail ? ? ? ? ? Advance Care Planning:   Code Status: Full Code  ? ?Consults: Cardiology ? ?Family Communication: Greater than 50% of time was spent discussing patient's condition and plan of care with him and his wife at the bedside.  All questions and concerns have been addressed.  They verbalized understanding and agree with the plan. ? ?Severity of Illness: ?The appropriate patient status for this patient is INPATIENT. Inpatient status is judged to be reasonable and necessary in order to provide the required intensity of service to ensure the patient's safety. The patient's presenting symptoms, physical exam findings, and initial radiographic and laboratory data in the context of their chronic comorbidities is felt to place them at high risk for further clinical deterioration. Furthermore, it is not anticipated that the patient will be medically stable for discharge from the hospital within 2 midnights of admission.  ? ?* I certify that at the point of  admission it is my clinical judgment that the patient will require inpatient hospital care spanning beyond 2 midnights from the point of admission due to high intensity of service, high risk for further deterioration and h

## 2021-05-14 NOTE — ED Triage Notes (Addendum)
To room 1 via [facility]. EMS called out for wheezing and shortness of breath. Per EMS  pt found to be in Afib RVR. Pt denies chest pain. Provider at bedside at this time. Pt Sinus rhythm rate in the 80s on ED monitoring.  ?

## 2021-05-14 NOTE — Assessment & Plan Note (Addendum)
Patient has been noncompliant with his antihypertensives for a month because of financial reasons.  Blood pressure was significantly elevated when he presented to the hospital. ?Patient he was supposed to be on metoprolol and combination tablet with olmesartan amlodipine and HCTZ. ?Started on amlodipine, irbesartan, Imdur and metoprolol.  Hydralazine was added today.  Blood pressures have improved.  Okay for discharge on current regimen.   ?

## 2021-05-14 NOTE — Assessment & Plan Note (Addendum)
Potassium was supplemented.  Magnesium 2.1.   ?

## 2021-05-14 NOTE — Assessment & Plan Note (Addendum)
Most likely secondary to demand ischemia from acute CHF.  ?Continue aspirin.  LDL was noted to be 151.  He will be sent out on higher dose of atorvastatin. ?

## 2021-05-14 NOTE — ED Notes (Signed)
Pt resting comfortably at this time. NAD noted. Pt hypertension, pt states he has been out of his BP meds for about 1 month. Pt states he does feel better after getting lasix. NAD noted. Call bell in reach. S/O at bedside. ? ?

## 2021-05-14 NOTE — Assessment & Plan Note (Addendum)
Patient admits to being noncompliant with prescribed medications and does not have a PCP.  Apparently has had issues with his insurance because he had to change jobs. ?Patient has a way to pay for his medications.  Cardiology will arrange outpatient follow-up.  Patient to establish with PCP for ?

## 2021-05-15 ENCOUNTER — Inpatient Hospital Stay (HOSPITAL_COMMUNITY)
Admit: 2021-05-15 | Discharge: 2021-05-15 | Disposition: A | Discharge status: Home / Self Care | Payer: PRIVATE HEALTH INSURANCE | Attending: Internal Medicine | Admitting: Internal Medicine

## 2021-05-15 DIAGNOSIS — I509 Heart failure, unspecified: Secondary | ICD-10-CM | POA: Insufficient documentation

## 2021-05-15 DIAGNOSIS — I160 Hypertensive urgency: Secondary | ICD-10-CM

## 2021-05-15 DIAGNOSIS — E876 Hypokalemia: Secondary | ICD-10-CM

## 2021-05-15 DIAGNOSIS — I5031 Acute diastolic (congestive) heart failure: Secondary | ICD-10-CM

## 2021-05-15 LAB — BASIC METABOLIC PANEL
Anion gap: UNDETERMINED (ref 5–15)
Anion gap: 12 (ref 5–15)
Anion gap: 10 (ref 5–15)
BUN: UNDETERMINED mg/dL (ref 6–20)
BUN: 17 mg/dL (ref 6–20)
BUN: 21 mg/dL — ABNORMAL HIGH (ref 6–20)
CO2: UNDETERMINED mmol/L (ref 22–32)
CO2: 26 mmol/L (ref 22–32)
CO2: 27 mmol/L (ref 22–32)
Calcium: UNDETERMINED mg/dL (ref 8.9–10.3)
Calcium: 9.4 mg/dL (ref 8.9–10.3)
Calcium: 9.5 mg/dL (ref 8.9–10.3)
Chloride: UNDETERMINED mmol/L (ref 98–111)
Chloride: 100 mmol/L (ref 98–111)
Chloride: 102 mmol/L (ref 98–111)
Creatinine, Ser: UNDETERMINED mg/dL (ref 0.61–1.24)
Creatinine, Ser: 1.29 mg/dL — ABNORMAL HIGH (ref 0.61–1.24)
Creatinine, Ser: 1.26 mg/dL — ABNORMAL HIGH (ref 0.61–1.24)
GFR, Estimated: UNDETERMINED mL/min (ref >60)
GFR, Estimated: >60 mL/min (ref >60)
GFR, Estimated: >60 mL/min (ref >60)
Glucose, Bld: UNDETERMINED mg/dL (ref 70–99)
Glucose, Bld: 108 mg/dL — ABNORMAL HIGH (ref 70–99)
Glucose, Bld: 101 mg/dL — ABNORMAL HIGH (ref 70–99)
Potassium: UNDETERMINED mmol/L (ref 3.5–5.1)
Potassium: 3.7 mmol/L (ref 3.5–5.1)
Potassium: 3.8 mmol/L (ref 3.5–5.1)
Sodium: UNDETERMINED mmol/L (ref 135–145)
Sodium: 138 mmol/L (ref 135–145)
Sodium: 139 mmol/L (ref 135–145)

## 2021-05-15 LAB — ECHOCARDIOGRAM COMPLETE
AR max vel: 1.83 cm2
AV Area VTI: 2.15 cm2
AV Area mean vel: 1.86 cm2
AV Mean grad: 2 mmHg
AV Peak grad: 3.9 mmHg
Ao pk vel: 0.99 m/s
Area-P 1/2: 3.31 cm2
Height: 75 in
MV VTI: 1.46 cm2
S' Lateral: 4.1 cm
Weight: 5600 oz

## 2021-05-15 LAB — HIV ANTIBODY (ROUTINE TESTING W REFLEX): HIV Screen 4th Generation wRfx: NONREACTIVE

## 2021-05-15 MED ORDER — ISOSORBIDE MONONITRATE ER 30 MG PO TB24
30.0000 mg | ORAL_TABLET | Freq: Every day | ORAL | Status: DC
  Administered 2021-05-15 – 2021-05-16 (×2): 30 mg via ORAL
  Filled 2021-05-15 (×2): qty 1

## 2021-05-15 NOTE — ED Notes (Signed)
Pt sleeping in bed, arrouseable. A/ox4, states he has been having increased SOB, ankle swelling and orthopnea x 1 month but called 911 because this increased and additionally had wheezing. Pt denies all complaints now and states his ankles are back to normal. LS dim throughout. Denies SOB, exertional dyspnea, orthopnea, CP.  ?

## 2021-05-15 NOTE — ED Notes (Signed)
Cardiology at bedside.

## 2021-05-15 NOTE — ED Notes (Signed)
Resting in bed with eyes closed. Resp even, unlabored on RA, appears sleeping. No distress noted at this time. ?

## 2021-05-15 NOTE — Progress Notes (Signed)
Admission profile updated. ?

## 2021-05-15 NOTE — Progress Notes (Signed)
*  PRELIMINARY RESULTS* ?Echocardiogram ?2D Echocardiogram has been performed. ? ?John Lowe, Dorene Sorrow ?05/15/2021, 8:09 AM ?

## 2021-05-15 NOTE — Progress Notes (Signed)
? ?Progress Note ? ?Patient Name: John Lowe ?Date of Encounter: 05/15/2021 ? ?[facility] Cardiologist: New ? ?Subjective  ? ?-2.6L net UOP, however may be more.kidney function stable Pressures still elevated. Patient denies chest pain or SOB.  ? ?Inpatient Medications  ?  ?Scheduled Meds: ? amLODipine  10 mg Oral Daily  ? aspirin EC  81 mg Oral Daily  ? atorvastatin  10 mg Oral QHS  ? enoxaparin (LOVENOX) injection  0.5 mg/kg Subcutaneous Q24H  ? furosemide  40 mg Intravenous Q12H  ? irbesartan  300 mg Oral Daily  ? metoprolol succinate  50 mg Oral Daily  ? potassium chloride  40 mEq Oral Daily  ? sodium chloride flush  3 mL Intravenous Q12H  ? ?Continuous Infusions: ? sodium chloride    ? ?PRN Meds: ?sodium chloride, acetaminophen **OR** acetaminophen, cyclobenzaprine, fluticasone, melatonin, ondansetron **OR** ondansetron (ZOFRAN) IV, sodium chloride flush  ? ?Vital Signs  ?  ?Vitals:  ? 05/15/21 0200 05/15/21 0400 05/15/21 0600 05/15/21 0700  ?BP: (!) 188/108 (!) 172/100 (!) 165/109 (!) 160/111  ?Pulse: 87 77 86 71  ?Resp: (!) 24 20 18 19   ?Temp:      ?TempSrc:      ?SpO2: 94% 97% 95% 94%  ?Weight:      ?Height:      ? ? ?Intake/Output Summary (Last 24 hours) at 05/15/2021 0744 ?Last data filed at 05/15/2021 05/17/2021 ?Gross per 24 hour  ?Intake --  ?Output 2665 ml  ?Net -2665 ml  ? ?Last 3 Weights 05/14/2021 09/17/2019 09/17/2019  ?Weight (lbs) 350 lb 358 lb 358 lb  ?Weight (kg) 158.759 kg 162.388 kg 162.388 kg  ?   ? ?Telemetry  ?  ?NSR, HR 80s, PACs/PVCs, brief SVT - Personally Reviewed ? ?ECG  ?  ?No new - Personally Reviewed ? ?Physical Exam  ? ?GEN: No acute distress.   ?Neck: No JVD ?Cardiac: RRR, + murmur, no rubs, or gallops.  ?Respiratory: Clear to auscultation bilaterally. ?GI: Soft, nontender, non-distended  ?MS: No edema; No deformity. ?Neuro:  Nonfocal  ?Psych: Normal affect  ? ?Labs  ?  ?High Sensitivity Troponin:   ?Recent Labs  ?Lab 05/14/21 ?0438 05/14/21 ?0629 05/14/21 ?0857 05/14/21 ?2001   ?TROPONINIHS 73* 82* 71* 70*  ?   ?Chemistry ?Recent Labs  ?Lab 05/14/21 ?0438 05/14/21 ?2001  ?NA 138  --   ?K 3.3*  --   ?CL 105  --   ?CO2 27  --   ?GLUCOSE 119*  --   ?BUN 20  --   ?CREATININE 1.19  --   ?CALCIUM 9.0  --   ?MG  --  2.1  ?PROT 7.2  --   ?ALBUMIN 3.8  --   ?AST 25  --   ?ALT 22  --   ?ALKPHOS 84  --   ?BILITOT 0.9  --   ?GFRNONAA >60  --   ?ANIONGAP 6  --   ?  ?Lipids No results for input(s): CHOL, TRIG, HDL, LABVLDL, LDLCALC, CHOLHDL in the last 168 hours.  ?Hematology ?Recent Labs  ?Lab 05/14/21 ?0438  ?WBC 8.6  ?RBC 4.79  ?HGB 13.4  ?HCT 42.2  ?MCV 88.1  ?MCH 28.0  ?MCHC 31.8  ?RDW 13.0  ?PLT 263  ? ?Thyroid No results for input(s): TSH, FREET4 in the last 168 hours.  ?BNP ?Recent Labs  ?Lab 05/14/21 ?0438  ?BNP 286.7*  ?  ?DDimer No results for input(s): DDIMER in the last 168 hours.  ? ?Radiology  ?  ?  DG Chest 2 View ? ?Result Date: 05/14/2021 ?CLINICAL DATA:  Dyspnea.  Evaluate for pulmonary congestion. EXAM: CHEST - 2 VIEW COMPARISON:  05/09/2012 FINDINGS: Mild cardiac enlargement. Low lung volumes. Diffuse pulmonary vascular congestion. No pleural effusion. No airspace consolidation. Visualized osseous structures are unremarkable. IMPRESSION: Cardiac enlargement and pulmonary vascular congestion. Electronically Signed   By: Signa Kell M.D.   On: 05/14/2021 05:25   ? ?Cardiac Studies  ? ?Echo ordered ? ?Patient Profile  ?   ?41 y.o. male with a hx of hypertension, morbid obesity, medical noncompliance, hyperlipidemia and hypertension who is being seen today for the evaluation of hypertensive urgency and new onset CHF  ? ?Assessment & Plan  ?  ?New onset acute congestive heart failure ?- in the setting of medical noncompliance and hypertensive urgency ?- IV lasix 40mg  BID ?- Echo pending ?- kidney function is stable ?- Good UOP ?- continue Toprol and Avapro ?- strict I/Os, daily weights, and monitor kidney function with diuresis ? ?Hypertensive urgency ?- related to medical noncompliance  and running out of his medications ?- restarted on PTA Toprol 50mg  daily ?- started on Avapro 300 mg daily ?- amlodipine 10mg  daily ?- Bps still mildly elevated, I will add Imdur 30mg  daily ? ?Hypokalemia ?- 3.3.today, supplement as needed ? ?Elevated troponin ?- mildly elevated in the setting of hypertensive urgency and CHF, likely demand ischemia ?- continue on Aspirin and statin ?- Echo pending ? ?For questions or updates, please contact [facility] ?Please consult [email or web address] for contact info under  ? ?  ?   ?Signed, ?Arlyn Bumpus , PA-C  ?05/15/2021, 7:44 AM    ?

## 2021-05-15 NOTE — Hospital Course (Signed)
41 y.o. male with medical history significant for hypertension, morbid obesity and medication noncompliance who presented to the emergency room for evaluation of a 3-week history of worsening shortness of breath mostly at night, orthopnea and on the day of admission wheezing.  Patient suspected of having acute CHF.  He was hospitalized for further management. ?

## 2021-05-15 NOTE — ED Notes (Signed)
BMP redrawn due to several abnormal results not consistent with patient presentation ?

## 2021-05-15 NOTE — TOC Initial Note (Addendum)
Transition of Care (TOC) - Initial/Assessment Note  ? ? ?Patient Details  ?Name: John Lowe ?MRN: [id number] ?Date of Birth: 1980-05-31 ? ?Transition of Care (TOC) CM/SW Contact:    ?Anselm Pancoast, RN ?Phone Number: ?05/15/2021, 11:45 AM ? ?Clinical Narrative:                 ?Spoke to patient and spouse at bedside. Both are very pleasant and eager to discharge home. Patient reports he was in between insurances and did not take his BP medications for several months but has insurance now. Needs assistance with selecting PCP-requesting appointment with Dr. Emily Filbert if possible. Strong family support and no discharge needs at this time other than PCP appointment. Works full time and is independent with his own care.  ? ?Placed on hold greater than 10 minutes trying to confirm if [facility] taking new patients. Will try again another time.  ? ?Expected Discharge Plan: Home/Self Care ?Barriers to Discharge: No Barriers Identified ? ? ?Patient Goals and CMS Choice ?Patient states their goals for this hospitalization and ongoing recovery are:: return home with family ?  ?  ? ?Expected Discharge Plan and Services ?Expected Discharge Plan: Home/Self Care ?  ?  ?  ?Living arrangements for the past 2 months: [place] ?                ?  ?  ?  ?  ?  ?  ?  ?  ?  ?  ? ?Prior Living Arrangements/Services ?Living arrangements for the past 2 months: [place] ?Lives with:: Spouse ?Patient language and need for interpreter reviewed:: Yes ?Do you feel safe going back to the place where you live?: Yes      ?Need for Family Participation in Patient Care: Yes (Comment) ?Care giver support system in place?: Yes (comment) ?  ?Criminal Activity/Legal Involvement Pertinent to Current Situation/Hospitalization: No - Comment as needed ? ?Activities of Daily Living ?Home Assistive Devices/Equipment: None ?ADL Screening (condition at time of admission) ?Patient's cognitive ability adequate to safely complete daily  activities?: Yes ?Is the patient deaf or have difficulty hearing?: No ?Does the patient have difficulty seeing, even when wearing glasses/contacts?: No ?Does the patient have difficulty concentrating, remembering, or making decisions?: No ?Patient able to express need for assistance with ADLs?: Yes ?Does the patient have difficulty dressing or bathing?: No ?Independently performs ADLs?: Yes (appropriate for developmental age) ?Does the patient have difficulty walking or climbing stairs?: No ?Weakness of Legs: None ?Weakness of Arms/Hands: None ? ?Permission Sought/Granted ?  ?  ?   ?   ?   ?   ? ?Emotional Assessment ?Appearance:: Appears stated age ?Attitude/Demeanor/Rapport: Engaged, Self-Confident ?Affect (typically observed): Accepting, Pleasant ?Orientation: : Oriented to Self, Oriented to Place, Oriented to  Time, Oriented to Situation ?Alcohol / Substance Use: Not Applicable ?Psych Involvement: No (comment) ? ?Admission diagnosis:  New onset of congestive heart failure ([place]) [I50.9] ?CHF (congestive heart failure) ([place]) [I50.9] ?Patient Active Problem List  ? Diagnosis Date Noted  ? CHF (congestive heart failure) ([place]) 05/15/2021  ? New onset of congestive heart failure ([place]) 05/14/2021  ? Hypertensive urgency 05/14/2021  ? Non compliance w medication regimen 05/14/2021  ? Elevated troponin 05/14/2021  ? Hypokalemia 05/14/2021  ? Elevated glucose 04/01/2017  ? Medication monitoring encounter 10/02/2015  ? Hypertension 08/15/2015  ? Hyperlipidemia 08/15/2015  ? Allergic rhinitis 08/15/2015  ? Obesity, Class III, BMI 40-49.9 (morbid obesity) ([place]) 08/31/2014  ? ?PCP:  Patient, No Pcp Per (Inactive) ?Pharmacy:   ?[facility] ?[place] [address] ?Phone: [phone] Fax: [phone] ? ?[facility], [place] [address] ?[place] [address] ?Phone: [phone] Fax: [phone] ? ? ? ? ?Social  Determinants of Health (SDOH) Interventions ?  ? ?Readmission Risk Interventions ?No flowsheet data found. ? ? ?

## 2021-05-15 NOTE — Consult Note (Addendum)
? ?  Heart Failure Nurse Navigator Note ? ?Echocardiogram is pending at this time. ? ?He presented to the emergency room with complaints of 3-week history of worsening shortness of breath, orthopnea and wheezing.  Gone several months without his medications.  Chest x-ray revealed cardiomegaly and pulmonary vascular congestion. ? ?Comorbidities: ? ?Hypertension ?Morbid obesity ?Medication noncompliance ? ?Labs: ? ?Sodium 138, potassium 3.7, chloride 100, CO2 26, BUN 17, creatinine 1.29 up from 1.19 of yesterday.  BNP on admission was 286. ?Weight 158.8 kg ?Blood pressure 132/105 ?Intake not documented ?Output 2665 mL ? ?Medications: ? ?Amlodipine 10 mg daily ?Aspirin 81 mg daily ?Atorvastatin 10 mg daily ?Furosemide 40 mg IV every 12 hours ?Avapro 300 mg daily ?Isosorbide mononitrate 30 mg daily ?Metoprolol succinate 50 mg daily ? ?Initial meeting with wife who was at the bedside.  He states that he knows that it was a big mistake to go without his medications.He now has insurance. ? ?Discussed low-sodium diet and fluid restriction.  Wife states that they are going to work on this together.  She states that she works from home and has his meals prepared by time he gets home in the evening.  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  They very rarely eat at restaurants. ? ?Discussed daily weights and what to report, along with symptoms to report.  He states that he has a scale at home. ? ?He has follow-up in the outpatient heart failure clinic on March 29 at 1:30pm.  He has a 15% no-show ratio of 2:13 appointments. ? ?Tresa Endo RN [facility] ?

## 2021-05-15 NOTE — Progress Notes (Signed)
? ?[facility] ?PROGRESS NOTE ? ? ?John Lowe [id number] DOB: 1981/02/01 DOA: 05/14/2021  1 ?DOS: the patient was seen and examined on 05/15/2021 ? ?PCP: Patient, No Pcp Per (Inactive) ? ?Brief History and Hospital Course:  ?41 y.o. male with medical history significant for hypertension, morbid obesity and medication noncompliance who presented to the emergency room for evaluation of a 3-week history of worsening shortness of breath mostly at night, orthopnea and on the day of admission wheezing.  Patient suspected of having acute CHF.  He was hospitalized for further management. ? ?Consultants: Cardiology ? ?Procedures: Transthoracic echocardiogram is pending ? ? ? ?Subjective: ?Patient mentions that he is feeling better compared to yesterday.  Less short of breath.  No chest pain.  No nausea vomiting. ? ? ? ?Assessment/Plan: ? ?* New onset of congestive heart failure (HCC) ?Concern is for acute congestive heart failure.  Echocardiogram is pending.  Patient seen by cardiology.  Started on IV diuretics.  Elevated blood pressure is contributing.   ?Further management based on results of echocardiogram. ?Strict ins and outs and daily weights.   ? ?Hypertensive urgency ?Patient has been noncompliant with his antihypertensives for a month because of financial reasons.  Blood pressure was significantly elevated when he presented to the hospital. ?Patient he was supposed to be on metoprolol and combination tablet with olmesartan amlodipine and HCTZ. ?Started on amlodipine, irbesartan, Imdur and metoprolol.  Monitor blood pressures closely.  May need further titration. ? ?Elevated troponin ?Most likely secondary to demand ischemia from acute CHF.  ?Continue aspirin, beta-blocker.  Also on statin.  Check lipid panel.  Last LDL in our system is from 1607 when it was 176. ? ?Hypokalemia ?Potassium was supplemented.  Magnesium 2.1.  Labs from this morning are pending. ? ?Obesity, Class III, BMI 40-49.9 (morbid  obesity) (HCC) ?Estimated body mass index is 43.75 kg/m? as calculated from the following: ?  Height as of this encounter: 6\' 3"  (1.905 m). ?  Weight as of this encounter: 158.8 kg. ? ? ?Non compliance w medication regimen ?Patient admits to being noncompliant with prescribed medications and does not have a PCP.  Apparently has had issues with his insurance because he had to change jobs. ?Will refer to [facility] upon discharge so the patient can establish care with a primary care provider ? ? ? ? ?DVT Prophylaxis: Lovenox ?Code Status: Full code ?Family Communication: Discussed with patient ?Disposition Plan: Hopefully return home when improved ? ?Status is: Inpatient ?Remains inpatient appropriate because: Acute CHF ? ? ? ? ?Medications: Scheduled: ? amLODipine  10 mg Oral Daily  ? aspirin EC  81 mg Oral Daily  ? atorvastatin  10 mg Oral QHS  ? enoxaparin (LOVENOX) injection  0.5 mg/kg Subcutaneous Q24H  ? furosemide  40 mg Intravenous Q12H  ? irbesartan  300 mg Oral Daily  ? isosorbide mononitrate  30 mg Oral Daily  ? metoprolol succinate  50 mg Oral Daily  ? potassium chloride  40 mEq Oral Daily  ? sodium chloride flush  3 mL Intravenous Q12H  ? ?Continuous: ? sodium chloride    ? ?[facility] chloride, acetaminophen **OR** acetaminophen, cyclobenzaprine, fluticasone, melatonin, ondansetron **OR** ondansetron (ZOFRAN) IV, sodium chloride flush ? ?Antibiotics: ?Anti-infectives (From admission, onward)  ? ? None  ? ?  ? ? ?Objective: ? ?Vital Signs ? ?Vitals:  ? 05/15/21 0400 05/15/21 0600 05/15/21 0700 05/15/21 0800  ?BP: (!) 172/100 (!) 165/109 (!) 160/111 (!) 177/111  ?Pulse: 77 86 71 82  ?Resp: 20  18 19 14   ?Temp:      ?TempSrc:      ?SpO2: 97% 95% 94% 96%  ?Weight:      ?Height:      ? ? ?Intake/Output Summary (Last 24 hours) at 05/15/2021 0930 ?Last data filed at 05/15/2021 [id number] ?Gross per 24 hour  ?Intake --  ?Output 2665 ml  ?Net -2665 ml  ? ?Filed Weights  ? 05/14/21 0434  ?Weight: (!) 158.8 kg  ? ? ?General  appearance: Awake alert.  In no distress ?Resp: Normal effort at rest.  Crackles bilateral bases.  No wheezing or rhonchi. ?Cardio: S1-S2 is normal regular.  No S3-S4.  No rubs murmurs or bruit ?GI: Abdomen is soft.  Nontender nondistended.  Bowel sounds are present normal.  No masses organomegaly ?Extremities: Minimal edema bilateral lower extremities ?Neurologic: Alert and oriented x3.  No focal neurological deficits.  ? ? ?Lab Results: ? ?Data Reviewed: I have personally reviewed labs and imaging study reports ? ?CBC: ?Recent Labs  ?Lab 05/14/21 ?0438  ?WBC 8.6  ?NEUTROABS 5.9  ?HGB 13.4  ?HCT 42.2  ?MCV 88.1  ?PLT 263  ? ? ?Basic Metabolic Panel: ?Recent Labs  ?Lab 05/14/21 ?0438 05/14/21 ?2001  ?NA 138  --   ?K 3.3*  --   ?CL 105  --   ?CO2 27  --   ?GLUCOSE 119*  --   ?BUN 20  --   ?CREATININE 1.19  --   ?CALCIUM 9.0  --   ?MG  --  2.1  ? ? ?GFR: ?Estimated Creatinine Clearance: 130.6 mL/min (by C-G formula based on SCr of 1.19 mg/dL). ? ?Liver Function Tests: ?Recent Labs  ?Lab 05/14/21 ?0438  ?AST 25  ?ALT 22  ?ALKPHOS 84  ?BILITOT 0.9  ?PROT 7.2  ?ALBUMIN 3.8  ? ? ? ?Recent Results (from the past 240 hour(s))  ?Resp Panel by RT-PCR (Flu A&B, Covid) Nasopharyngeal Swab     Status: None  ? Collection Time: 05/14/21  4:38 AM  ? Specimen: Nasopharyngeal Swab; Nasopharyngeal(NP) swabs in vial transport medium  ?Result Value Ref Range Status  ? SARS Coronavirus 2 by RT PCR NEGATIVE NEGATIVE Final  ?  Comment: (NOTE) ?SARS-CoV-2 target nucleic acids are NOT DETECTED. ? ?The SARS-CoV-2 RNA is generally detectable in upper respiratory ?specimens during the acute phase of infection. The lowest ?concentration of SARS-CoV-2 viral copies this assay can detect is ?138 copies/mL. A negative result does not preclude SARS-Cov-2 ?infection and should not be used as the sole basis for treatment or ?other patient management decisions. A negative result may occur with  ?improper specimen collection/handling, submission of  specimen other ?than nasopharyngeal swab, presence of viral mutation(s) within the ?areas targeted by this assay, and inadequate number of viral ?copies(<138 copies/mL). A negative result must be combined with ?clinical observations, patient history, and epidemiological ?information. The expected result is Negative. ? ?Fact Sheet for Patients:  ?BloggerCourse.comhttps://www.fda.gov/media/152166/download ? ?Fact Sheet for Healthcare Providers:  ?SeriousBroker.ithttps://www.fda.gov/media/152162/download ? ?This test is no t yet approved or cleared by the [place] FDA and  ?has been authorized for detection and/or diagnosis of SARS-CoV-2 by ?FDA under an Emergency Use Authorization (EUA). This EUA will remain  ?in effect (meaning this test can be used) for the duration of the ?COVID-19 declaration under Section 564(b)(1) of the Act, 21 ?U.S.C.section 360bbb-3(b)(1), unless the authorization is terminated  ?or revoked sooner.  ? ? ?  ? Influenza A by PCR NEGATIVE NEGATIVE Final  ? Influenza B by PCR NEGATIVE  NEGATIVE Final  ?  Comment: (NOTE) ?The Xpert Xpress SARS-CoV-2/FLU/RSV plus assay is intended as an aid ?in the diagnosis of influenza from Nasopharyngeal swab specimens and ?should not be used as a sole basis for treatment. Nasal washings and ?aspirates are unacceptable for Xpert Xpress SARS-CoV-2/FLU/RSV ?testing. ? ?Fact Sheet for Patients: ?BloggerCourse.com ? ?Fact Sheet for Healthcare Providers: ?SeriousBroker.it ? ?This test is not yet approved or cleared by the [place] FDA and ?has been authorized for detection and/or diagnosis of SARS-CoV-2 by ?FDA under an Emergency Use Authorization (EUA). This EUA will remain ?in effect (meaning this test can be used) for the duration of the ?COVID-19 declaration under Section 564(b)(1) of the Act, 21 U.S.C. ?section 360bbb-3(b)(1), unless the authorization is terminated or ?revoked. ? ?Performed at [facility], [address] [place], ?[place] [address] ?  ?  ? ? ?Radiology Studies: ?DG Chest 2 View ? ?Result Date: 05/14/2021 ?CLINICAL DATA:  Dyspnea.  Evaluate for pulmonary congestion. EXAM: CHEST - 2 VIEW COMPARISON:  05/09/2012 FINDINGS:

## 2021-05-15 NOTE — ED Notes (Signed)
ECHO at bedside.

## 2021-05-15 NOTE — ED Notes (Signed)
Pt wants to ambulate around room so pt removed from cardiac monitor. Pt understood the need for repeated vitals at specified intervals. Pt ambulated to bathroom. ?

## 2021-05-16 DIAGNOSIS — I5021 Acute systolic (congestive) heart failure: Secondary | ICD-10-CM

## 2021-05-16 LAB — BASIC METABOLIC PANEL
Anion gap: 7 (ref 5–15)
BUN: 22 mg/dL — ABNORMAL HIGH (ref 6–20)
CO2: 29 mmol/L (ref 22–32)
Calcium: 9.5 mg/dL (ref 8.9–10.3)
Chloride: 102 mmol/L (ref 98–111)
Creatinine, Ser: 1.29 mg/dL — ABNORMAL HIGH (ref 0.61–1.24)
GFR, Estimated: >60 mL/min (ref >60)
Glucose, Bld: 103 mg/dL — ABNORMAL HIGH (ref 70–99)
Potassium: 3.7 mmol/L (ref 3.5–5.1)
Sodium: 138 mmol/L (ref 135–145)

## 2021-05-16 LAB — LIPID PANEL
Cholesterol: 209 mg/dL — ABNORMAL HIGH (ref 0–200)
HDL: 41 mg/dL (ref >40)
LDL Cholesterol: 151 mg/dL — ABNORMAL HIGH (ref 0–99)
Total CHOL/HDL Ratio: 5.1 RATIO
Triglycerides: 83 mg/dL (ref <150)
VLDL: 17 mg/dL (ref 0–40)

## 2021-05-16 MED ORDER — AMLODIPINE BESYLATE 10 MG PO TABS: 10.0000 mg | ORAL_TABLET | Freq: Every day | ORAL | 2 refills | Status: DC

## 2021-05-16 MED ORDER — POTASSIUM CHLORIDE CRYS ER 20 MEQ PO TBCR
40.0000 meq | EXTENDED_RELEASE_TABLET | Freq: Two times a day (BID) | ORAL | 0 refills | Status: DC
Start: 2021-05-16 — End: 2021-05-29

## 2021-05-16 MED ORDER — SPIRONOLACTONE 25 MG PO TABS: 25.0000 mg | ORAL_TABLET | Freq: Every day | ORAL | Status: DC

## 2021-05-16 MED ORDER — HYDRALAZINE HCL 25 MG PO TABS: 25.0000 mg | ORAL_TABLET | Freq: Three times a day (TID) | ORAL | 2 refills | Status: DC

## 2021-05-16 MED ORDER — ATORVASTATIN CALCIUM 20 MG PO TABS: 40.0000 mg | ORAL_TABLET | Freq: Every day | ORAL | Status: DC

## 2021-05-16 MED ORDER — FUROSEMIDE 40 MG PO TABS
40.0000 mg | ORAL_TABLET | Freq: Every day | ORAL | Status: DC
  Administered 2021-05-16: 40 mg via ORAL
  Filled 2021-05-16: qty 1

## 2021-05-16 MED ORDER — FUROSEMIDE 20 MG PO TABS: 20.0000 mg | ORAL_TABLET | Freq: Every day | ORAL | Status: DC

## 2021-05-16 MED ORDER — IRBESARTAN 300 MG PO TABS: 300.0000 mg | ORAL_TABLET | Freq: Every day | ORAL | 2 refills | Status: DC

## 2021-05-16 MED ORDER — ISOSORBIDE MONONITRATE ER 30 MG PO TB24: 30.0000 mg | ORAL_TABLET | Freq: Every day | ORAL | 2 refills | Status: DC

## 2021-05-16 MED ORDER — METOPROLOL SUCCINATE ER 50 MG PO TB24: 50.0000 mg | ORAL_TABLET | Freq: Every day | ORAL | 2 refills | Status: DC

## 2021-05-16 MED ORDER — FUROSEMIDE 40 MG PO TABS: 40.0000 mg | ORAL_TABLET | Freq: Every day | ORAL | 2 refills | Status: DC

## 2021-05-16 MED ORDER — ATORVASTATIN CALCIUM 40 MG PO TABS: 40.0000 mg | ORAL_TABLET | Freq: Every day | ORAL | 2 refills | Status: DC

## 2021-05-16 MED ORDER — HYDRALAZINE HCL 25 MG PO TABS
25.0000 mg | ORAL_TABLET | Freq: Three times a day (TID) | ORAL | Status: DC
  Administered 2021-05-16: 25 mg via ORAL
  Filled 2021-05-16: qty 1

## 2021-05-16 NOTE — Discharge Summary (Signed)
?[facility] ? ?Physician Discharge Summary  ? ?Patient ID: ?John Lowe ?MRN: [id number] ?DOB/AGE: 1980/12/03 41 y.o. ? ?Admit date: 05/14/2021 ?Discharge date:   05/16/2021 ? ? ?PCP: Patient, No Pcp Per (Inactive) ? ?DISCHARGE DIAGNOSES:  ?Principal Problem: ?  Acute systolic CHF (congestive heart failure) (HCC) ?Active Problems: ?  Hypertensive urgency ?  Elevated troponin ?  Hypokalemia ?  Obesity, Class III, BMI 40-49.9 (morbid obesity) (HCC) ?  Non compliance w medication regimen ? ? ?RECOMMENDATIONS FOR OUTPATIENT FOLLOW UP: ?Outpatient follow-up with cardiology and PCP ? ?Home Health: None ?Equipment/Devices: None ? ?CODE STATUS: Full code ? ?DISCHARGE CONDITION: fair ? ?Diet recommendation: Heart healthy ? ?INITIAL HISTORY: ?41 y.o. male with medical history significant for hypertension, morbid obesity and medication noncompliance who presented to the emergency room for evaluation of a 3-week history of worsening shortness of breath mostly at night, orthopnea and on the day of admission wheezing.  Patient suspected of having acute CHF.  He was hospitalized for further management. ? ?Consultants: Cardiology ?  ?Procedures: Transthoracic echocardiogram ? ? ?HOSPITAL COURSE:  ? ?* Acute systolic CHF (congestive heart failure) (HCC) ?This is a new diagnosis for the patient.  Echocardiogram shows EF to be 45 to 50%.  Patient was seen by cardiology.  Poorly controlled blood pressure is thought to be the reason for his cardiac dysfunction.  Patient was diuresed.  He is feeling better.  Blood pressure medications were adjusted.  Discussed with cardiology today.  Patient really wants to go home.  Okay for discharge current medications.  Cardiology will arrange outpatient follow-up.   ? ?Hypertensive urgency ?Patient has been noncompliant with his antihypertensives for a month because of financial reasons.  Blood pressure was significantly elevated when he presented to the hospital. ?Patient he was supposed  to be on metoprolol and combination tablet with olmesartan amlodipine and HCTZ. ?Started on amlodipine, irbesartan, Imdur and metoprolol.  Hydralazine was added today.  Blood pressures have improved.  Okay for discharge on current regimen.   ? ?Elevated troponin ?Most likely secondary to demand ischemia from acute CHF.  ?Continue aspirin.  LDL was noted to be 151.  He will be sent out on higher dose of atorvastatin. ? ?Hypokalemia ?Potassium was supplemented.  Magnesium 2.1.   ? ?Obesity, Class III, BMI 40-49.9 (morbid obesity) (HCC) ?Estimated body mass index is 43.75 kg/m? as calculated from the following: ?  Height as of this encounter: 6\' 3"  (1.905 m). ?  Weight as of this encounter: 158.8 kg. ? ? ?Non compliance w medication regimen ?Patient admits to being noncompliant with prescribed medications and does not have a PCP.  Apparently has had issues with his insurance because he had to change jobs. ?Patient has a way to pay for his medications.  Cardiology will arrange outpatient follow-up.  Patient to establish with PCP for ? ? ? ?Patient wants to go home today.  Okay for discharge since blood pressure has improved. ? ? ?PERTINENT LABS: ? ?The results of significant diagnostics from this hospitalization (including imaging, microbiology, ancillary and laboratory) are listed below for reference.   ? ?Microbiology: ?Recent Results (from the past 240 hour(s))  ?Resp Panel by RT-PCR (Flu A&B, Covid) Nasopharyngeal Swab     Status: None  ? Collection Time: 05/14/21  4:38 AM  ? Specimen: Nasopharyngeal Swab; Nasopharyngeal(NP) swabs in vial transport medium  ?Result Value Ref Range Status  ? SARS Coronavirus 2 by RT PCR NEGATIVE NEGATIVE Final  ?  Comment: (NOTE) ?SARS-CoV-2 target nucleic acids  are NOT DETECTED. ? ?The SARS-CoV-2 RNA is generally detectable in upper respiratory ?specimens during the acute phase of infection. The lowest ?concentration of SARS-CoV-2 viral copies this assay can detect is ?138 copies/mL.  A negative result does not preclude SARS-Cov-2 ?infection and should not be used as the sole basis for treatment or ?other patient management decisions. A negative result may occur with  ?improper specimen collection/handling, submission of specimen other ?than nasopharyngeal swab, presence of viral mutation(s) within the ?areas targeted by this assay, and inadequate number of viral ?copies(<138 copies/mL). A negative result must be combined with ?clinical observations, patient history, and epidemiological ?information. The expected result is Negative. ? ?Fact Sheet for Patients:  ?BloggerCourse.com ? ?Fact Sheet for Healthcare Providers:  ?SeriousBroker.it ? ?This test is no t yet approved or cleared by the [place] FDA and  ?has been authorized for detection and/or diagnosis of SARS-CoV-2 by ?FDA under an Emergency Use Authorization (EUA). This EUA will remain  ?in effect (meaning this test can be used) for the duration of the ?COVID-19 declaration under Section 564(b)(1) of the Act, 21 ?U.S.C.section 360bbb-3(b)(1), unless the authorization is terminated  ?or revoked sooner.  ? ? ?  ? Influenza A by PCR NEGATIVE NEGATIVE Final  ? Influenza B by PCR NEGATIVE NEGATIVE Final  ?  Comment: (NOTE) ?The Xpert Xpress SARS-CoV-2/FLU/RSV plus assay is intended as an aid ?in the diagnosis of influenza from Nasopharyngeal swab specimens and ?should not be used as a sole basis for treatment. Nasal washings and ?aspirates are unacceptable for Xpert Xpress SARS-CoV-2/FLU/RSV ?testing. ? ?Fact Sheet for Patients: ?BloggerCourse.com ? ?Fact Sheet for Healthcare Providers: ?SeriousBroker.it ? ?This test is not yet approved or cleared by the [place] FDA and ?has been authorized for detection and/or diagnosis of SARS-CoV-2 by ?FDA under an Emergency Use Authorization (EUA). This EUA will remain ?in effect (meaning this test  can be used) for the duration of the ?COVID-19 declaration under Section 564(b)(1) of the Act, 21 U.S.C. ?section 360bbb-3(b)(1), unless the authorization is terminated or ?revoked. ? ?Performed at [facility], [address] [place], ?[place] [address] ?  ?  ? ?Labs: ? ?COVID-19 Labs ? ? ?Lab Results  ?Component Value Date  ? SARSCOV2NAA NEGATIVE 05/14/2021  ? ? ? ? ?Basic Metabolic Panel: ?Recent Labs  ?Lab 05/14/21 ?0438 05/14/21 ?2001 05/15/21 ?1010 05/15/21 ?1047 05/15/21 ?1431 05/16/21 ?5009  ?NA 138  --  SPECIMEN CONTAMINATED, UNABLE TO PERFORM TEST(S). 138 139 138  ?K 3.3*  --  SPECIMEN CONTAMINATED, UNABLE TO PERFORM TEST(S). 3.7 3.8 3.7  ?CL 105  --  SPECIMEN CONTAMINATED, UNABLE TO PERFORM TEST(S). 100 102 102  ?CO2 27  --  SPECIMEN CONTAMINATED, UNABLE TO PERFORM TEST(S). 26 27 29   ?GLUCOSE 119*  --  SPECIMEN CONTAMINATED, UNABLE TO PERFORM TEST(S). 108* 101* 103*  ?BUN 20  --  SPECIMEN CONTAMINATED, UNABLE TO PERFORM TEST(S). 17 21* 22*  ?CREATININE 1.19  --  SPECIMEN CONTAMINATED, UNABLE TO PERFORM TEST(S). 1.29* 1.26* 1.29*  ?CALCIUM 9.0  --  SPECIMEN CONTAMINATED, UNABLE TO PERFORM TEST(S). 9.4 9.5 9.5  ?MG  --  2.1  --   --   --   --   ? ?Liver Function Tests: ?Recent Labs  ?Lab 05/14/21 ?0438  ?AST 25  ?ALT 22  ?ALKPHOS 84  ?BILITOT 0.9  ?PROT 7.2  ?ALBUMIN 3.8  ? ? ?CBC: ?Recent Labs  ?Lab 05/14/21 ?0438  ?WBC 8.6  ?NEUTROABS 5.9  ?HGB 13.4  ?HCT 42.2  ?  MCV 88.1  ?PLT 263  ? ? ?BNP: ?BNP (last 3 results) ?Recent Labs  ?  05/14/21 ?0438  ?BNP 286.7*  ? ? ? ?IMAGING STUDIES ?DG Chest 2 View ? ?Result Date: 05/14/2021 ?CLINICAL DATA:  Dyspnea.  Evaluate for pulmonary congestion. EXAM: CHEST - 2 VIEW COMPARISON:  05/09/2012 FINDINGS: Mild cardiac enlargement. Low lung volumes. Diffuse pulmonary vascular congestion. No pleural effusion. No airspace consolidation. Visualized osseous structures are unremarkable. IMPRESSION: Cardiac enlargement and pulmonary vascular congestion. Electronically  Signed   By: Signa Kellaylor  Stroud M.D.   On: 05/14/2021 05:25  ? ?ECHOCARDIOGRAM COMPLETE ? ?Result Date: 05/15/2021 ?   ECHOCARDIOGRAM REPORT   Patient Name:   John FlemingsJERMAINE Amer Date of Exam: 05/15/2021 [facility]

## 2021-05-16 NOTE — TOC Progression Note (Signed)
Transition of Care (TOC) - Progression Note  ? ? ?Patient Details  ?Name: John Lowe ?MRN: [id number] ?Date of Birth: 07/16/1980 ? ?Transition of Care (TOC) CM/SW Contact  ?Gildardo Griffes, LCSW ?Phone Number: ?05/16/2021, 3:06 PM ? ?Clinical Narrative:    ? ?Patient to dc today.  ? ?Patient reports he has insurance wife is bringing his card info, he said he has no problems getting his medications with preference of [facility] pharmacy on [address].  ? ?No further dc needs per patient.  ? ? ?Expected Discharge Plan: Home/Self Care ?Barriers to Discharge: No Barriers Identified ? ?Expected Discharge Plan and Services ?Expected Discharge Plan: Home/Self Care ?  ?  ?  ?Living arrangements for the past 2 months: Single Family Home ?Expected Discharge Date: 05/16/21               ?  ?  ?  ?  ?  ?  ?  ?  ?  ?  ? ? ?Social Determinants of Health (SDOH) Interventions ?  ? ?Readmission Risk Interventions ?No flowsheet data found. ? ?

## 2021-05-16 NOTE — Progress Notes (Signed)
? ?Progress Note ? ?Patient Name: John Lowe ?Date of Encounter: 05/16/2021 ? ?[facility] Cardiologist: Dr. Kirke Corin ? ?Subjective  ? ?Bps better yesterday. No anginal symptoms reported. Patient says he wants to go home.  ? ?Inpatient Medications  ?  ?Scheduled Meds: ? amLODipine  10 mg Oral Daily  ? aspirin EC  81 mg Oral Daily  ? atorvastatin  10 mg Oral QHS  ? enoxaparin (LOVENOX) injection  0.5 mg/kg Subcutaneous Q24H  ? furosemide  40 mg Intravenous Q12H  ? irbesartan  300 mg Oral Daily  ? isosorbide mononitrate  30 mg Oral Daily  ? metoprolol succinate  50 mg Oral Daily  ? potassium chloride  40 mEq Oral Daily  ? sodium chloride flush  3 mL Intravenous Q12H  ? ?Continuous Infusions: ? sodium chloride    ? ?PRN Meds: ?sodium chloride, acetaminophen **OR** acetaminophen, cyclobenzaprine, fluticasone, melatonin, ondansetron **OR** ondansetron (ZOFRAN) IV, sodium chloride flush  ? ?Vital Signs  ?  ?Vitals:  ? 05/15/21 2341 05/16/21 0503 05/16/21 0603 05/16/21 0815  ?BP: (!) 143/101 (!) 171/117 (!) 151/102 (!) 172/118  ?Pulse:  79  75  ?Resp: 20 16 16 20   ?Temp:  98.2 ?F (36.8 ?C)  98.5 ?F (36.9 ?C)  ?TempSrc:      ?SpO2:  100%  98%  ?Weight:  (!) 156.4 kg    ?Height:      ? ? ?Intake/Output Summary (Last 24 hours) at 05/16/2021 0933 ?Last data filed at 05/16/2021 0800 ?Gross per 24 hour  ?Intake 500 ml  ?Output 2125 ml  ?Net -1625 ml  ? ?Last 3 Weights 05/16/2021 05/14/2021 09/17/2019  ?Weight (lbs) 344 lb 14.4 oz 350 lb 358 lb  ?Weight (kg) 156.446 kg 158.759 kg 162.388 kg  ?   ? ?Telemetry  ?  ?NSR, HR 60, PACs/PVCS - Personally Reviewed ? ?ECG  ?  ?No new - Personally Reviewed ? ?Physical Exam  ? ?GEN: No acute distress.   ?Neck: No JVD ?Cardiac: RRR, no murmurs, rubs, or gallops.  ?Respiratory: Clear to auscultation bilaterally. ?GI: Soft, nontender, non-distended  ?MS: No edema; No deformity. ?Neuro:  Nonfocal  ?Psych: Normal affect  ? ?Labs  ?  ?High Sensitivity Troponin:   ?Recent Labs  ?Lab  05/14/21 ?0438 05/14/21 ?0629 05/14/21 ?0857 05/14/21 ?2001  ?TROPONINIHS 73* 82* 71* 70*  ?   ?Chemistry ?Recent Labs  ?Lab 05/14/21 ?0438 05/14/21 ?2001 05/15/21 ?1010 05/15/21 ?1047 05/15/21 ?1431 05/16/21 ?05/18/21  ?NA 138  --    < > 138 139 138  ?K 3.3*  --    < > 3.7 3.8 3.7  ?CL 105  --    < > 100 102 102  ?CO2 27  --    < > 26 27 29   ?GLUCOSE 119*  --    < > 108* 101* 103*  ?BUN 20  --    < > 17 21* 22*  ?CREATININE 1.19  --    < > 1.29* 1.26* 1.29*  ?CALCIUM 9.0  --    < > 9.4 9.5 9.5  ?MG  --  2.1  --   --   --   --   ?PROT 7.2  --   --   --   --   --   ?ALBUMIN 3.8  --   --   --   --   --   ?AST 25  --   --   --   --   --   ?ALT 22  --   --   --   --   --   ?  ALKPHOS 84  --   --   --   --   --   ?BILITOT 0.9  --   --   --   --   --   ?GFRNONAA >60  --    < > >60 >60 >60  ?ANIONGAP 6  --    < > 12 10 7   ? < > = values in this interval not displayed.  ?  ?Lipids  ?Recent Labs  ?Lab 05/16/21 ?05/18/21  ?CHOL 209*  ?TRIG 83  ?HDL 41  ?LDLCALC 151*  ?CHOLHDL 5.1  ?  ?Hematology ?Recent Labs  ?Lab 05/14/21 ?0438  ?WBC 8.6  ?RBC 4.79  ?HGB 13.4  ?HCT 42.2  ?MCV 88.1  ?MCH 28.0  ?MCHC 31.8  ?RDW 13.0  ?PLT 263  ? ?Thyroid No results for input(s): TSH, FREET4 in the last 168 hours.  ?BNP ?Recent Labs  ?Lab 05/14/21 ?0438  ?BNP 286.7*  ?  ?DDimer No results for input(s): DDIMER in the last 168 hours.  ? ?Radiology  ?  ?ECHOCARDIOGRAM COMPLETE ? ?Result Date: 05/15/2021 ?   ECHOCARDIOGRAM REPORT   Patient Name:   BERNELL SIGAL Date of Exam: 05/15/2021 Medical Rec #:  05/17/2021        Height:       75.0 in Accession #:    [id number]       Weight:       350.0 lb Date of Birth:  [birth year]         BSA:          2.784 m? Patient Age:    41 years         BP:           160/111 mmHg Patient Gender: M                HR:           71 bpm. Exam Location:  [facility] Procedure: 2D Echo, Cardiac Doppler and Color Doppler Indications:     CHF---acute diastolic I50.31  History:         Patient has no prior history of Echocardiogram  examinations.                  Risk Factors:Dyslipidemia and Hypertension.  Sonographer:     11-13-1973 Referring Phys:  Cristela Blue AGBATA Diagnosing Phys: [id number] MD  Sonographer Comments: Suboptimal apical window. IMPRESSIONS  1. Left ventricular ejection fraction, by estimation, is 45 to 50%. The left ventricle has mildly decreased function. The left ventricle demonstrates global hypokinesis. The left ventricular internal cavity size was mildly dilated. There is moderate left ventricular hypertrophy. Left ventricular diastolic parameters are indeterminate.  2. Right ventricular systolic function is normal. The right ventricular size is normal. Tricuspid regurgitation signal is inadequate for assessing PA pressure.  3. Left atrial size was severely dilated.  4. Right atrial size was moderately dilated.  5. The mitral valve is normal in structure. No evidence of mitral valve regurgitation. No evidence of mitral stenosis.  6. The aortic valve is normal in structure. Aortic valve regurgitation is not visualized. No aortic stenosis is present.  7. The inferior vena cava is normal in size with greater than 50% respiratory variability, suggesting right atrial pressure of 3 mmHg. FINDINGS  Left Ventricle: Left ventricular ejection fraction, by estimation, is 45 to 50%. The left ventricle has mildly decreased function. The left ventricle demonstrates global hypokinesis. The left ventricular internal cavity size was mildly dilated. There  is  moderate left ventricular hypertrophy. Left ventricular diastolic parameters are indeterminate. Right Ventricle: The right ventricular size is normal. No increase in right ventricular wall thickness. Right ventricular systolic function is normal. Tricuspid regurgitation signal is inadequate for assessing PA pressure. Left Atrium: Left atrial size was severely dilated. Right Atrium: Right atrial size was moderately dilated. Pericardium: There is no evidence of pericardial  effusion. Mitral Valve: The mitral valve is normal in structure. No evidence of mitral valve regurgitation. No evidence of mitral valve stenosis. MV peak gradient, 5.1 mmHg. The mean mitral valve gradient is 2.0 mmHg. Tricuspid Valve: The tricuspid valve is normal in structure. Tricuspid valve regurgitation is not demonstrated. No evidence of tricuspid stenosis. Aortic Valve: The aortic valve is normal in structure. Aortic valve regurgitation is not visualized. No aortic stenosis is present. Aortic valve mean gradient measures 2.0 mmHg. Aortic valve peak gradient measures 3.9 mmHg. Aortic valve area, by VTI measures 2.15 cm?. Pulmonic Valve: The pulmonic valve was normal in structure. Pulmonic valve regurgitation is not visualized. No evidence of pulmonic stenosis. Aorta: The aortic root is normal in size and structure. Venous: The inferior vena cava is normal in size with greater than 50% respiratory variability, suggesting right atrial pressure of 3 mmHg. IAS/Shunts: No atrial level shunt detected by color flow Doppler.  LEFT VENTRICLE PLAX 2D LVIDd:         5.80 cm   Diastology LVIDs:         4.10 cm   LV e' medial:    4.57 cm/s LV PW:         1.50 cm   LV E/e' medial:  9.4 LV IVS:        1.30 cm   LV e' lateral:   8.81 cm/s LVOT diam:     2.10 cm   LV E/e' lateral: 4.9 LV SV:         32 LV SV Index:   11 LVOT Area:     3.46 cm?  RIGHT VENTRICLE RV Basal diam:  4.30 cm RV S prime:     10.00 cm/s LEFT ATRIUM              Index        RIGHT ATRIUM           Index LA diam:        6.20 cm  2.23 cm/m?   RA Area:     31.20 cm? LA Vol (A2C):   181.0 ml 65.02 ml/m?  RA Volume:   115.00 ml 41.31 ml/m? LA Vol (A4C):   155.0 ml 55.68 ml/m? LA Biplane Vol: 170.0 ml 61.07 ml/m?  AORTIC VALVE                    PULMONIC VALVE AV Area (Vmax):    1.83 cm?     PV Vmax:        0.85 m/s AV Area (Vmean):   1.86 cm?     PV Vmean:       61.300 cm/s AV Area (VTI):     2.15 cm?     PV VTI:         0.151 m AV Vmax:           98.60 cm/s    PV Peak grad:   2.9 mmHg AV Vmean:          63.467 cm/s  PV Mean grad:   2.0 mmHg AV VTI:  0.148 m      RVOT Peak grad: 4 mmHg AV Peak Grad:      3.9 mmHg AV Mean Grad:      2.0 mmHg LVOT Vmax:         52.20 cm/s LVO

## 2021-05-16 NOTE — Progress Notes (Signed)
? ?  Heart Failure Nurse Navigator Note ? 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 ? ?Met with patient today he was sitting up in the chair at bedside in no acute distress. ? ?By teach back method discussed diet, fluid restriction and daily weights.  No reinforcement needed. ? ?He states that this episode really has frightened him and he does not care to return to the hospital for another episode like this.  He states that the [facility] is working on getting him a PCP and medications will be phoned into his pharmacy. ? ?Stressed keeping his appointment in the outpatient heart failure clinic.  He had no further questions. ? ?Labs: ? ?Sodium 138, potassium 3.7, chloride 102, CO2 29, BUN 22, creatinine 1.29 up from 1.26.  Total cholesterol 209, triglycerides 83, HDL 41, and LDL 151. ? ?Medications: ? ?Amlodipine 10 mg daily ?Aspirin 81 mg daily ?Atorvastatin 10 mg daily ?Furosemide 40 mg by mouth daily ?Irbesartan 300 mg daily ?Isosorbide mononitrate 30 mg daily ?Hydralazine 25 mg 3 times a day ?Metoprolol succinate 50 mg daily ? ?Pricilla Riffle RN [facility] ?

## 2021-05-23 NOTE — Progress Notes (Signed)
? Patient ID: John Lowe, male    DOB: 08/22/1980, 41 y.o.   MRN: [id number] ? ?HPI ? ?John Lowe is a 41 y/o male with a history of hyperlipidemia, HTN, obesity and chronic heart failure.  ? ?Echo report from 05/15/21 reviewed and showed an EF of 45-50% along with moderate LVH and severe LAE.  ? ?Admitted 05/14/21 due to shortness of breath due to acute HF. Cardiology consult obtained. Elevated troponin thought to be due to demand ischemia. Medications started for HTN. Hypokalemia corrected. Discharged after 2 days.  ? ?He presents today for his initial visit with a chief complaint of minimal fatigue upon moderate exertion. He describes this as having been present for several months. He has no other symptoms and specifically denies any difficulty sleeping, dizziness, abdominal distention, palpitations, pedal edema, chest pain, shortness of breath, cough or weight gain.  ? ?Overall he says that he's feeling much better and plans to do everything he can to be as healthy as long as he can. Weighing daily and watching sodium intake. Keeping daily fluid intake to ~ 60 ounces daily.  ? 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 Does notice some swelling in his legs at the end of this shift.  ? ?Past Medical History:  ?Diagnosis Date  ? CHF (congestive heart failure) (HCC)   ? Hyperlipidemia   ? Hypertension   ? Obesity, Class III, BMI 40-49.9 (morbid obesity) (HCC)   ? ?Past Surgical History:  ?Procedure Laterality Date  ? ANKLE SURGERY    ? rotator cuff Right   ? ?Family History  ?Problem Relation Age of Onset  ? Hypertension Mother   ? Hypertension Father   ? Heart disease Father   ? Kidney disease Father   ? Hypertension Sister   ? Hypertension Brother   ? Cancer Maternal Grandmother   ?     breast  ? Cancer Maternal Grandfather   ?     bone cancer  ? Hypertension Paternal Grandmother   ? ?Social History  ? ?Tobacco Use  ? Smoking status: Never  ? Smokeless tobacco: Never  ?Substance Use  Topics  ? Alcohol use: No  ? ?No Known Allergies ? ?Prior to Admission medications   ?Medication Sig Start Date End Date Taking? Authorizing Provider  ?aspirin 81 MG tablet Take 81 mg by mouth daily.   Yes [provider]  ?atorvastatin (LIPITOR) 40 MG tablet Take 1 tablet (40 mg total) by mouth at bedtime. 05/16/21  Yes Osvaldo ShipperKrishnan, Gokul, MD  ?furosemide (LASIX) 40 MG tablet Take 1 tablet (40 mg total) by mouth daily. 05/17/21  Yes Osvaldo ShipperKrishnan, Gokul, MD  ?hydrALAZINE (APRESOLINE) 25 MG tablet Take 1 tablet (25 mg total) by mouth 3 (three) times daily. 05/16/21  Yes Osvaldo ShipperKrishnan, Gokul, MD  ?isosorbide mononitrate (IMDUR) 30 MG 24 hr tablet Take 1 tablet (30 mg total) by mouth daily. 05/17/21  Yes Osvaldo ShipperKrishnan, Gokul, MD  ?metoprolol succinate (TOPROL-XL) 50 MG 24 hr tablet Take 1 tablet (50 mg total) by mouth daily. TAKE 1 TABLET BY MOUTH ONCE DAILY (LAST  REFILL,  NEEDS  FOLLOW  UP) 05/16/21  Yes Osvaldo ShipperKrishnan, Gokul, MD  ?potassium chloride SA (KLOR-CON M) 20 MEQ tablet Take 2 tablets (40 mEq total) by mouth 2 (two) times daily. 05/16/21  Yes Osvaldo ShipperKrishnan, Gokul, MD  ? ?Review of Systems  ?Constitutional:  Positive for fatigue (very little). Negative for appetite change.  ?HENT:  Negative for congestion, postnasal drip and sore throat.   ?  Eyes: Negative.   ?Respiratory:  Negative for cough, chest tightness and shortness of breath.   ?Cardiovascular:  Negative for chest pain, palpitations and leg swelling.  ?Gastrointestinal:  Negative for abdominal distention and abdominal pain.  ?Endocrine: Negative.   ?Genitourinary: Negative.   ?Musculoskeletal:  Negative for back pain and neck pain.  ?Skin: Negative.   ?Allergic/Immunologic: Negative.   ?Neurological:  Negative for dizziness and light-headedness.  ?Hematological:  Negative for adenopathy. Does not bruise/bleed easily.  ?Psychiatric/Behavioral:  Negative for dysphoric mood and sleep disturbance (sleeping on 2 pillows). The patient is not nervous/anxious.   ? ?Vitals:  ?  05/24/21 1333  ?BP: 136/89  ?Pulse: 78  ?Resp: 14  ?SpO2: 100%  ?Weight: (!) 349 lb 4 oz (158.4 kg)  ?Height: 6\' 3"  (1.905 m)  ? ?Wt Readings from Last 3 Encounters:  ?05/24/21 (!) 349 lb 4 oz (158.4 kg)  ?05/16/21 (!) 344 lb 14.4 oz (156.4 kg)  ?09/17/19 (!) 358 lb (162.4 kg)  ? ?Lab Results  ?Component Value Date  ? CREATININE 1.29 (H) 05/16/2021  ? CREATININE 1.26 (H) 05/15/2021  ? CREATININE 1.29 (H) 05/15/2021  ? ?Physical Exam ?Vitals and nursing note reviewed.  ?Constitutional:   ?   Appearance: Normal appearance.  ?HENT:  ?   Head: Normocephalic and atraumatic.  ?Cardiovascular:  ?   Rate and Rhythm: Normal rate and regular rhythm.  ?Pulmonary:  ?   Effort: Pulmonary effort is normal. No respiratory distress.  ?   Breath sounds: No wheezing or rales.  ?Abdominal:  ?   General: There is no distension.  ?   Palpations: Abdomen is soft.  ?   Tenderness: There is no abdominal tenderness.  ?Musculoskeletal:     ?   General: No tenderness.  ?   Cervical back: Normal range of motion and neck supple.  ?   Right lower leg: Edema (trace pitting) present.  ?   Left lower leg: Edema (trace pitting) present.  ?Skin: ?   General: Skin is warm and dry.  ?Neurological:  ?   General: No focal deficit present.  ?   Mental Status: He is alert and oriented to person, place, and time.  ?Psychiatric:     ?   Mood and Affect: Mood normal.     ?   Behavior: Behavior normal.     ?   Thought Content: Thought content normal.  ? ? ?Assessment & Plan: ? ?1: Chronic heart failure with mildly reduced ejection fraction- ?- NYHA class II ?- euvolemic today ?- weighing daily and understands to call for an overnight weight gain of > 2 pounds or a weekly weight gain of > 5 pounds ?- not adding salt and has been reading food labels for sodium content; reviewed the importance of keeping daily sodium to < 2000mg  / day ?- used to drink >100 ounces of fluid daily prior to his admission but now only drinks ~ 60 ounces/ day ?- on GDMT of irbesartan  and metoprolol ?- will have him finish out his irbesartan and amlodipine (~ 3 weeks left) and then he will not refill them and will begin entresto 24/26mg  BID; 30 day voucher provided for the entresto ?- will check BMP next visit ?- discussed adding SGLT2 with then taking furosemide/potassium PRN ?- consider using bidil in placed of hydralazine/ isosorbide ?- [facility] cardiology saw patient during recent admission ?- BNP 05/14/21 was 286.7 ? ?2: HTN- ?- BP looks good (136/89) ?- scheduled NP appointment with PCP @   Family Practice on 06/08/21 ?- BMP 05/16/21 reviewed and showed sodium 138, potassium 3.7, creatinine 1.29 and GFR >60 ? ? ?Medication bottles reviewed.  ? ?Return in 6 weeks, sooner if needed.  ? ?

## 2021-05-24 ENCOUNTER — Ambulatory Visit: Payer: Self-pay | Attending: Family | Admitting: Family

## 2021-05-24 ENCOUNTER — Encounter: Payer: Self-pay | Admitting: Family

## 2021-05-24 VITALS — BP 136/89 | HR 78 | Resp 14 | Ht 75.0 in | Wt 349.2 lb

## 2021-05-24 DIAGNOSIS — I5022 Chronic systolic (congestive) heart failure: Secondary | ICD-10-CM

## 2021-05-24 DIAGNOSIS — R5383 Other fatigue: Secondary | ICD-10-CM | POA: Insufficient documentation

## 2021-05-24 DIAGNOSIS — I110 Hypertensive heart disease with heart failure: Secondary | ICD-10-CM | POA: Insufficient documentation

## 2021-05-24 DIAGNOSIS — I10 Essential (primary) hypertension: Secondary | ICD-10-CM

## 2021-05-24 DIAGNOSIS — Z79899 Other long term (current) drug therapy: Secondary | ICD-10-CM | POA: Insufficient documentation

## 2021-05-24 DIAGNOSIS — I509 Heart failure, unspecified: Secondary | ICD-10-CM | POA: Insufficient documentation

## 2021-05-24 DIAGNOSIS — E785 Hyperlipidemia, unspecified: Secondary | ICD-10-CM | POA: Insufficient documentation

## 2021-05-24 DIAGNOSIS — Z7901 Long term (current) use of anticoagulants: Secondary | ICD-10-CM | POA: Insufficient documentation

## 2021-05-24 DIAGNOSIS — M7989 Other specified soft tissue disorders: Secondary | ICD-10-CM | POA: Insufficient documentation

## 2021-05-24 DIAGNOSIS — Z6841 Body Mass Index (BMI) 40.0 and over, adult: Secondary | ICD-10-CM | POA: Insufficient documentation

## 2021-05-24 MED ORDER — SACUBITRIL-VALSARTAN 24-26 MG PO TABS: 1.0000 | ORAL_TABLET | Freq: Two times a day (BID) | ORAL | 3 refills | Status: DC

## 2021-05-24 NOTE — Patient Instructions (Addendum)
Continue weighing daily and call for an overnight weight gain of 3 pounds or more or a weekly weight gain of more than 5 pounds. ? ? ?If you have voicemail, please make sure your mailbox is cleaned out so that we may leave a message and please make sure to listen to any voicemails.  ? ? ?Finish taking your irbesartan and amlodipine. Once you finish these bottles, you will not get them refilled. You will then start taking entresto 24/26mg  as 1 tablet in the morning and 1 tablet in the evening.  ? ? ? ?

## 2021-05-29 ENCOUNTER — Other Ambulatory Visit: Payer: Self-pay | Admitting: Family

## 2021-05-29 ENCOUNTER — Telehealth: Payer: Self-pay | Admitting: Cardiovascular Disease

## 2021-05-29 MED ORDER — POTASSIUM CHLORIDE CRYS ER 20 MEQ PO TBCR: 40.0000 meq | EXTENDED_RELEASE_TABLET | Freq: Two times a day (BID) | ORAL | 5 refills | Status: DC

## 2021-05-29 NOTE — Telephone Encounter (Signed)
Attempted to schedule no ans no vm  

## 2021-05-29 NOTE — Telephone Encounter (Signed)
-----   Message from Yvonne Kendall, MD sent at 05/16/2021  2:12 PM EDT ----- ?Regarding: Hospital f/u ?Hello, ? ?Mr. Heier will be d/c'ed today.  Could you arrange for follow with Dr. Kirke Corin or an APP in ~2 weeks?  Thanks. ? ?Thayer Ohm ? ?

## 2021-06-01 NOTE — Telephone Encounter (Signed)
Attempted to schedule no ans no vm  

## 2021-06-08 ENCOUNTER — Encounter: Payer: Self-pay | Admitting: Family Medicine

## 2021-06-08 ENCOUNTER — Ambulatory Visit: Payer: No Typology Code available for payment source | Admitting: Family Medicine

## 2021-06-08 VITALS — BP 129/74 | HR 64 | Temp 98.0°F | Resp 16 | Ht 75.5 in | Wt 344.0 lb

## 2021-06-08 DIAGNOSIS — I10 Essential (primary) hypertension: Secondary | ICD-10-CM

## 2021-06-08 DIAGNOSIS — Z8249 Family history of ischemic heart disease and other diseases of the circulatory system: Secondary | ICD-10-CM

## 2021-06-08 DIAGNOSIS — I5023 Acute on chronic systolic (congestive) heart failure: Secondary | ICD-10-CM | POA: Diagnosis not present

## 2021-06-08 DIAGNOSIS — Z83438 Family history of other disorder of lipoprotein metabolism and other lipidemia: Secondary | ICD-10-CM

## 2021-06-08 DIAGNOSIS — R7309 Other abnormal glucose: Secondary | ICD-10-CM | POA: Diagnosis not present

## 2021-06-08 DIAGNOSIS — Z09 Encounter for follow-up examination after completed treatment for conditions other than malignant neoplasm: Secondary | ICD-10-CM | POA: Diagnosis not present

## 2021-06-08 DIAGNOSIS — Z833 Family history of diabetes mellitus: Secondary | ICD-10-CM | POA: Insufficient documentation

## 2021-06-08 DIAGNOSIS — E782 Mixed hyperlipidemia: Secondary | ICD-10-CM

## 2021-06-08 DIAGNOSIS — Z1159 Encounter for screening for other viral diseases: Secondary | ICD-10-CM

## 2021-06-08 NOTE — Assessment & Plan Note (Signed)
Noted  

## 2021-06-08 NOTE — Assessment & Plan Note (Signed)
Seen at [facility] for 3 wk course of CP; dx of HFrEF ?

## 2021-06-08 NOTE — Assessment & Plan Note (Signed)
Low risk screen ?Treatable, and curable. If left untreated Hep C can lead to cirrhosis and liver failure. ?Encourage routine testing; recommend re-testing if risk factors change. ? ?

## 2021-06-08 NOTE — Assessment & Plan Note (Signed)
Chronic, stable ?Denies CP ?Denies SOB/ DOE ?Denies low blood pressure/hypotension ?Denies vision changes ?Trace LE Edema noted on exam ?Continue medication, Asa 81 mg, Lasix 40 mg, Hydral 25 mg TID, Imdur 30 mg, Toprol 50 mg, Entresto 24-26 mg ?F/b Cards ?RTC in 6 months for CPE ?Seek emergent care if you develop chest pain or chest pressure ? ?

## 2021-06-08 NOTE — Assessment & Plan Note (Signed)
Body mass index is 42.43 kg/m?. ?Discussed importance of healthy weight management ?Discussed diet and exercise ?Associated with HTN, HLD, HFrEF 40-45% on ECHO 04/2021 ?

## 2021-06-08 NOTE — Assessment & Plan Note (Signed)
New diagnosis, was seen and diagnosed at [facility] last month ?Review of d/c summary, labs and new medications ?Now is followed by cards ?BP stable today ?Patient working on 5-10% weight reduction to assist in mgmt ?

## 2021-06-08 NOTE — Progress Notes (Signed)
? ?[employer] as a Neurosurgeon for [name], FNP.,have documented all relevant documentation on the behalf of [name], FNP,as directed by  [name], FNP while in the presence of [name], FNP.  ? ?New patient visit ? ? ?Patient: John Lowe   DOB: 1980/07/12   41 y.o. Male  MRN: [id number] ?Visit Date: 06/08/2021 ? ?Today's healthcare provider: [name], FNP  ? ?Introduced to Publishing rights manager role and practice setting.  All questions answered.  Discussed provider/patient relationship and expectations. ? ? ?Chief Complaint  ?Patient presents with  ? New Patient (Initial Visit)  ? ?Subjective  ?  ?John Lowe is a 41 y.o. male who presents today as a new patient to establish care.  ? ?HPI  ? ?Patient was seen at [facility] with new dx of HFrEF d/t previously uncontrolled HTN and HLD. He has been without a PCP for some time. ? ?Past Medical History:  ?Diagnosis Date  ? Arthritis   ? CHF (congestive heart failure) (HCC)   ? Hyperlipidemia   ? Hypertension   ? Obesity, Class III, BMI 40-49.9 (morbid obesity) (HCC)   ? ?Past Surgical History:  ?Procedure Laterality Date  ? ANKLE SURGERY    ? rotator cuff Right   ? ?Family Status  ?Relation Name Status  ? Mother  Alive  ? Father  Deceased  ?     heart and kidney failure  ? Sister  Alive  ? Brother  Alive  ? MGM  Deceased  ?     cancer breast  ? MGF  Deceased  ?     old age, bone cancer  ? PGM  Alive  ? PGF  Deceased  ?     unknown  ? ?Family History  ?Problem Relation Age of Onset  ? Hypertension Mother   ? Hypertension Father   ? Heart disease Father   ? Kidney disease Father   ? Hypertension Sister   ? Hypertension Brother   ? Cancer Maternal Grandmother   ?     breast  ? Cancer Maternal Grandfather   ?     bone cancer  ? Hypertension Paternal Grandmother   ? ?Social History  ? ?Socioeconomic History  ? Marital status: Married  ?  Spouse name: Not on file  ? Number of children: Not on file  ? Years of education: Not on file  ?  Highest education level: Not on file  ?Occupational History  ? Not on file  ?Tobacco Use  ? Smoking status: Never  ? Smokeless tobacco: Never  ?Vaping Use  ? Vaping Use: Never used  ?Substance and Sexual Activity  ? Alcohol use: No  ? Drug use: No  ? Sexual activity: Not Currently  ?Other Topics Concern  ? Not on file  ?Social History Narrative  ? Not on file  ? ?Social Determinants of Health  ? ?Financial Resource Strain: Not on file  ?Food Insecurity: Not on file  ?Transportation Needs: Not on file  ?Physical Activity: Not on file  ?Stress: Not on file  ?Social Connections: Not on file  ? ?Outpatient Medications Prior to Visit  ?Medication Sig  ? aspirin 81 MG tablet Take 81 mg by mouth daily.  ? atorvastatin (LIPITOR) 40 MG tablet Take 1 tablet (40 mg total) by mouth at bedtime.  ? furosemide (LASIX) 40 MG tablet Take 1 tablet (40 mg total) by mouth daily.  ? hydrALAZINE (APRESOLINE) 25 MG tablet Take 1 tablet (  25 mg total) by mouth 3 (three) times daily.  ? isosorbide mononitrate (IMDUR) 30 MG 24 hr tablet Take 1 tablet (30 mg total) by mouth daily.  ? metoprolol succinate (TOPROL-XL) 50 MG 24 hr tablet Take 1 tablet (50 mg total) by mouth daily. TAKE 1 TABLET BY MOUTH ONCE DAILY (LAST  REFILL,  NEEDS  FOLLOW  UP)  ? potassium chloride SA (KLOR-CON M) 20 MEQ tablet Take 2 tablets (40 mEq total) by mouth 2 (two) times daily.  ? sacubitril-valsartan (ENTRESTO) 24-26 MG Take 1 tablet by mouth 2 (two) times daily.  ? ?No facility-administered medications prior to visit.  ? ?No Known Allergies ? ? ?There is no immunization history on file for this patient. ? ?Health Maintenance  ?Topic Date Due  ? COVID-19 Vaccine (1) Never done  ? Hepatitis C Screening  Never done  ? INFLUENZA VACCINE  09/26/2021  ? TETANUS/TDAP  06/24/2023  ? HIV Screening  Completed  ? HPV VACCINES  Aged Out  ? ? ?Patient Care Team: ?[name], FNP as PCP - General (Family Medicine) ?Iran Ouch, MD as Consulting Physician  (Cardiology) ? ?Review of Systems  ?All other systems reviewed and are negative. ? ? ? ? Objective  ?  ?BP 129/74   Pulse 64   Temp 98 ?F (36.7 ?C) (Oral)   Resp 16   Ht 6' 3.5" (1.918 m)   Wt (!) 344 lb (156 kg)   SpO2 97%   BMI 42.43 kg/m?  ? ? ?Physical Exam ?Vitals and nursing note reviewed.  ?Constitutional:   ?   Appearance: Normal appearance. He is obese.  ?HENT:  ?   Head: Normocephalic and atraumatic.  ?Eyes:  ?   Pupils: Pupils are equal, round, and reactive to light.  ?Cardiovascular:  ?   Rate and Rhythm: Normal rate and regular rhythm.  ?   Pulses: Normal pulses.  ?   Heart sounds: Normal heart sounds.  ?Pulmonary:  ?   Effort: Pulmonary effort is normal.  ?   Breath sounds: Normal breath sounds.  ?Musculoskeletal:     ?   General: Normal range of motion.  ?   Cervical back: Normal range of motion.  ?   Right lower leg: Edema present.  ?   Left lower leg: Edema present.  ?   Comments: Trace edema in BLE, ankles  ?Skin: ?   General: Skin is warm and dry.  ?   Capillary Refill: Capillary refill takes less than 2 seconds.  ?Neurological:  ?   General: No focal deficit present.  ?   Mental Status: He is alert and oriented to person, place, and time. Mental status is at baseline.  ?Psychiatric:     ?   Mood and Affect: Mood normal.     ?   Behavior: Behavior normal.     ?   Thought Content: Thought content normal.     ?   Judgment: Judgment normal.  ? ? ?Depression Screen ? ?  05/24/2021  ?  1:39 PM 03/10/2018  ?  2:51 PM 04/01/2017  ?  3:36 PM 08/09/2016  ? 10:42 AM  ?PHQ 2/9 Scores  ?PHQ - 2 Score 0 0 0 0  ?PHQ- 9 Score  0    ? ?No results found for any visits on 06/08/21. ? Assessment & Plan   ?  ? ?Problem List Items Addressed This Visit   ? ?  ? Cardiovascular and Mediastinum  ? Hypertension (Chronic)  ?  Chronic, stable ?Denies CP ?Denies SOB/ DOE ?Denies low blood pressure/hypotension ?Denies vision changes ?Trace LE Edema noted on exam ?Continue medication, Asa 81 mg, Lasix 40 mg, Hydral 25 mg TID,  Imdur 30 mg, Toprol 50 mg, Entresto 24-26 mg ?F/b Cards ?RTC in 6 months for CPE ?Seek emergent care if you develop chest pain or chest pressure ? ?  ?  ? Acute on chronic HFrEF (heart failure with reduced ejection fraction) (HCC)  ?  New diagnosis, was seen and diagnosed at [facility] last month ?Review of d/c summary, labs and new medications ?[facility] ?BP stable today ?Patient working on 5-10% weight reduction to assist in mgmt ?  ?  ?  ? Other  ? Hyperlipidemia (Chronic)  ?  Chronic, previously untreated ?Now on Lipitor 40 mg ?We recommend diet low in saturated fat and regular exercise - 30 min at least 5 times per week ?Plan to get LP after 6 months ?  ?  ? Elevated glucose  ?  Acute, unknown cause ?Will check A1c ?Continue to recommend balanced, lower carb meals. Smaller meal size, adding snacks. Choosing water as drink of choice and increasing purposeful exercise. ? ?  ?  ? Relevant Orders  ? Hemoglobin A1c  ? Encounter for hepatitis C screening test for low risk patient  ?  Low risk screen ?Treatable, and curable. If left untreated Hep C can lead to cirrhosis and liver failure. ?Encourage routine testing; recommend re testing if risk factors change. ? ?  ?  ? Relevant Orders  ? Hepatitis C Antibody  ? Family history of diabetes mellitus type II  ?  Will check A1c today with hx of HTN, HLD and now dx of HFrEF ?Elevated glucose on 5 am labs inpatient, unsure if he was getting IVFs with dextrose ?  ?  ? Family history of hyperlipidemia  ?  Noted ?  ?  ? Family history of hypertension  ?  Noted ?  ?  ? Hospital discharge follow-up - Primary  ?  [facility]; dx of HFrEF ?  ?  ? Morbid obesity (HCC)  ?  Body mass index is 42.43 kg/m?. ?Discussed importance of healthy weight management ?Discussed diet and exercise ?Associated with HTN, HLD, HFrEF 40-45% on ECHO 04/2021 ?  ?  ? ? ? ?Return in about 6 months (around 12/08/2021) for annual examination.  ?  ? ?I, Jacky KindleElise T Armoni Kludt, FNP, have  reviewed all documentation for this visit. The documentation on 06/08/21 for the exam, diagnosis, procedures, and orders are all accurate and complete. ? ?Jacky KindleElise T Ashon Rosenberg, FNP  ?[facility] ?[phone] (ph

## 2021-06-08 NOTE — Assessment & Plan Note (Signed)
Chronic, previously untreated ?Now on Lipitor 40 mg ?We recommend diet low in saturated fat and regular exercise - 30 min at least 5 times per week ?Plan to get LP after 6 months ?

## 2021-06-08 NOTE — Assessment & Plan Note (Signed)
Acute, unknown cause ?Will check A1c ?Continue to recommend balanced, lower carb meals. Smaller meal size, adding snacks. Choosing water as drink of choice and increasing purposeful exercise. ? ?

## 2021-06-08 NOTE — Assessment & Plan Note (Signed)
Will check A1c today with hx of HTN, HLD and now dx of HFrEF ?Elevated glucose on 5 am labs inpatient, unsure if he was getting IVFs with dextrose ?

## 2021-06-09 LAB — HEPATITIS C ANTIBODY: Hep C Virus Ab: NONREACTIVE

## 2021-06-09 LAB — HEMOGLOBIN A1C
Est. average glucose Bld gHb Est-mCnc: 120 mg/dL
Hgb A1c MFr Bld: 5.8 % — ABNORMAL HIGH (ref 4.8–5.6)

## 2021-06-21 NOTE — Telephone Encounter (Signed)
Attempted to schedule.  No ans no vm  

## 2021-06-21 NOTE — Telephone Encounter (Signed)
3 attempts to schedule fu appt  .  Closing encounter.   

## 2021-06-21 NOTE — Telephone Encounter (Signed)
Noted  

## 2021-07-03 ENCOUNTER — Ambulatory Visit: Payer: Self-pay | Attending: Family | Admitting: Family

## 2021-07-03 ENCOUNTER — Encounter: Payer: Self-pay | Admitting: Family

## 2021-07-03 ENCOUNTER — Other Ambulatory Visit
Admission: RE | Admit: 2021-07-03 | Discharge: 2021-07-03 | Disposition: A | Discharge status: Home / Self Care | Payer: Self-pay | Source: Ambulatory Visit | Attending: Family | Admitting: Family

## 2021-07-03 VITALS — BP 141/89 | HR 76 | Resp 18 | Ht 75.0 in | Wt 344.0 lb

## 2021-07-03 DIAGNOSIS — Z6841 Body Mass Index (BMI) 40.0 and over, adult: Secondary | ICD-10-CM | POA: Insufficient documentation

## 2021-07-03 DIAGNOSIS — Z79899 Other long term (current) drug therapy: Secondary | ICD-10-CM | POA: Insufficient documentation

## 2021-07-03 DIAGNOSIS — I110 Hypertensive heart disease with heart failure: Secondary | ICD-10-CM | POA: Insufficient documentation

## 2021-07-03 DIAGNOSIS — I10 Essential (primary) hypertension: Secondary | ICD-10-CM

## 2021-07-03 DIAGNOSIS — I509 Heart failure, unspecified: Secondary | ICD-10-CM | POA: Insufficient documentation

## 2021-07-03 DIAGNOSIS — I5022 Chronic systolic (congestive) heart failure: Secondary | ICD-10-CM | POA: Insufficient documentation

## 2021-07-03 DIAGNOSIS — E785 Hyperlipidemia, unspecified: Secondary | ICD-10-CM | POA: Insufficient documentation

## 2021-07-03 LAB — BASIC METABOLIC PANEL
Anion gap: 7 (ref 5–15)
BUN: 18 mg/dL (ref 6–20)
CO2: 25 mmol/L (ref 22–32)
Calcium: 9.2 mg/dL (ref 8.9–10.3)
Chloride: 106 mmol/L (ref 98–111)
Creatinine, Ser: 1.36 mg/dL — ABNORMAL HIGH (ref 0.61–1.24)
GFR, Estimated: >60 mL/min (ref >60)
Glucose, Bld: 87 mg/dL (ref 70–99)
Potassium: 4.1 mmol/L (ref 3.5–5.1)
Sodium: 138 mmol/L (ref 135–145)

## 2021-07-03 MED ORDER — DAPAGLIFLOZIN PROPANEDIOL 10 MG PO TABS: 10.0000 mg | ORAL_TABLET | Freq: Every day | ORAL | 5 refills | Status: DC

## 2021-07-03 NOTE — Patient Instructions (Addendum)
Continue weighing daily and call for an overnight weight gain of 3 pounds or more or a weekly weight gain of more than 5 pounds. ? ? ?If you have voicemail, please make sure your mailbox is cleaned out so that we may leave a message and please make sure to listen to any voicemails.  ? ? ? ?Start taking farxiga as 1 tablet every morning. You will then decrease your furosemide (lasix) to 1/2 tablet every day and also decrease your potassium to just 2 tablets once daily ? ? ?Checking your lab work today and I'll call your wife once I get the results back ?

## 2021-07-03 NOTE — Progress Notes (Signed)
? Patient ID: John Lowe, male    DOB: 06/15/1980, 41 y.o.   MRN: [id number] ? ?HPI ? ?John Lowe is a 41 y/o male with a history of hyperlipidemia, HTN, obesity and chronic heart failure.  ? ?Echo report from 05/15/21 reviewed and showed an EF of 45-50% along with moderate LVH and severe LAE.  ? ?Admitted 05/14/21 due to shortness of breath due to acute HF. Cardiology consult obtained. Elevated troponin thought to be due to demand ischemia. Medications started for HTN. Hypokalemia corrected. Discharged after 2 days.  ? ?He presents today for a follow-up visit with a chief complaint of minimal fatigue upon moderate exertion. Describes this as chronic in nature. He has no other complaints and specifically denies dizziness, difficulty sleeping, abdominal distention, palpitations, pedal edema, chest pain, shortness of breath, cough or weight gain. Did recently twist his right ankle but feels like it is improving.  ? ?Not adding any salt to his food and keeping to the fluid restriction of ~ 64 ounces daily.  ? 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 Does notice some swelling in his legs at the end of this shift.  ? ?Past Medical History:  ?Diagnosis Date  ? Arthritis   ? CHF (congestive heart failure) (HCC)   ? Hyperlipidemia   ? Hypertension   ? Obesity, Class III, BMI 40-49.9 (morbid obesity) (HCC)   ? ?Past Surgical History:  ?Procedure Laterality Date  ? ANKLE SURGERY    ? rotator cuff Right   ? ?Family History  ?Problem Relation Age of Onset  ? Hypertension Mother   ? Hypertension Father   ? Heart disease Father   ? Kidney disease Father   ? Hypertension Sister   ? Hypertension Brother   ? Cancer Maternal Grandmother   ?     breast  ? Cancer Maternal Grandfather   ?     bone cancer  ? Hypertension Paternal Grandmother   ? ?Social History  ? ?Tobacco Use  ? Smoking status: Never  ? Smokeless tobacco: Never  ?Substance Use Topics  ? Alcohol use: No  ? ?No Known Allergies ? ?Prior to  Admission medications   ?Medication Sig Start Date End Date Taking? Authorizing Provider  ?aspirin 81 MG tablet Take 81 mg by mouth daily.   Yes [provider]  ?atorvastatin (LIPITOR) 40 MG tablet Take 1 tablet (40 mg total) by mouth at bedtime. 05/16/21  Yes Osvaldo ShipperKrishnan, Gokul, MD  ?furosemide (LASIX) 40 MG tablet Take 1 tablet (40 mg total) by mouth daily. 05/17/21  Yes Osvaldo ShipperKrishnan, Gokul, MD  ?hydrALAZINE (APRESOLINE) 25 MG tablet Take 1 tablet (25 mg total) by mouth 3 (three) times daily. 05/16/21  Yes Osvaldo ShipperKrishnan, Gokul, MD  ?isosorbide mononitrate (IMDUR) 30 MG 24 hr tablet Take 1 tablet (30 mg total) by mouth daily. 05/17/21  Yes Osvaldo ShipperKrishnan, Gokul, MD  ?metoprolol succinate (TOPROL-XL) 50 MG 24 hr tablet Take 1 tablet (50 mg total) by mouth daily. TAKE 1 TABLET BY MOUTH ONCE DAILY (LAST  REFILL,  NEEDS  FOLLOW  UP) 05/16/21  Yes Osvaldo ShipperKrishnan, Gokul, MD  ?potassium chloride SA (KLOR-CON M) 20 MEQ tablet Take 2 tablets (40 mEq total) by mouth 2 (two) times daily. 05/29/21  Yes Delma FreezeHackney, Teddrick Mallari A, FNP  ?sacubitril-valsartan (ENTRESTO) 24-26 MG Take 1 tablet by mouth 2 (two) times daily. 05/24/21  Yes Delma FreezeHackney, Kiet Geer A, FNP  ? ? ?Review of Systems  ?Constitutional:  Positive for fatigue (very little). Negative for  appetite change.  ?HENT:  Negative for congestion, postnasal drip and sore throat.   ?Eyes: Negative.   ?Respiratory:  Negative for cough, chest tightness and shortness of breath.   ?Cardiovascular:  Negative for chest pain, palpitations and leg swelling.  ?Gastrointestinal:  Negative for abdominal distention and abdominal pain.  ?Endocrine: Negative.   ?Genitourinary: Negative.   ?Musculoskeletal:  Negative for back pain and neck pain.  ?Skin: Negative.   ?Allergic/Immunologic: Negative.   ?Neurological:  Negative for dizziness and light-headedness.  ?Hematological:  Negative for adenopathy. Does not bruise/bleed easily.  ?Psychiatric/Behavioral:  Negative for dysphoric mood and sleep disturbance (sleeping on 2  pillows). The patient is not nervous/anxious.   ? ?Vitals:  ? 07/03/21 1401  ?BP: (!) 141/89  ?Pulse: 76  ?Resp: 18  ?SpO2: 99%  ?Weight: (!) 344 lb (156 kg)  ?Height: 6\' 3"  (1.905 m)  ? ?Wt Readings from Last 3 Encounters:  ?07/03/21 (!) 344 lb (156 kg)  ?06/08/21 (!) 344 lb (156 kg)  ?05/24/21 (!) 349 lb 4 oz (158.4 kg)  ? ?Lab Results  ?Component Value Date  ? CREATININE 1.29 (H) 05/16/2021  ? CREATININE 1.26 (H) 05/15/2021  ? CREATININE 1.29 (H) 05/15/2021  ? ?Physical Exam ?Vitals and nursing note reviewed.  ?Constitutional:   ?   Appearance: Normal appearance.  ?HENT:  ?   Head: Normocephalic and atraumatic.  ?Cardiovascular:  ?   Rate and Rhythm: Normal rate and regular rhythm.  ?Pulmonary:  ?   Effort: Pulmonary effort is normal. No respiratory distress.  ?   Breath sounds: No wheezing or rales.  ?Abdominal:  ?   General: There is no distension.  ?   Palpations: Abdomen is soft.  ?   Tenderness: There is no abdominal tenderness.  ?Musculoskeletal:     ?   General: No tenderness.  ?   Cervical back: Normal range of motion and neck supple.  ?   Right lower leg: Edema (trace pitting) present.  ?   Left lower leg: Edema (trace pitting) present.  ?Skin: ?   General: Skin is warm and dry.  ?Neurological:  ?   General: No focal deficit present.  ?   Mental Status: He is alert and oriented to person, place, and time.  ?Psychiatric:     ?   Mood and Affect: Mood normal.     ?   Behavior: Behavior normal.     ?   Thought Content: Thought content normal.  ? ? ?Assessment & Plan: ? ?1: Chronic heart failure with mildly reduced ejection fraction- ?- NYHA class II ?- euvolemic today ?- weighing daily; reminded to call for an overnight weight gain of > 2 pounds or a weekly weight gain of > 5 pounds ?- weight down 5 pounds from last visit here 6 weeks ago ?- not adding salt and has been reading food labels for sodium content; reviewed the importance of keeping daily sodium to < 2000mg  / day ?- keeping his daily fluid intake  to between 60-64 ounces ?- on GDMT of entresto and metoprolol ?- check BMP today since entresto was added at last visit ?- will begin farxiga 10mg  daily & decrease furosemide to 20mg  daily (was 40mg ) and decrease his potassium to 81meq daily (was 49meq BID) ?- will recheck labs next visit and change his furosemide/potassium to PRN at that time if able ?- consider using bidil in placed of hydralazine/ isosorbide ?- [facility] cardiology saw patient during recent admission ?- BNP 05/14/21 was 286.7 ? ?  2: HTN- ?- BP mildly elevated (141/89); better at PCP visit 3 weeks ago (129/74) ?- saw PCP Rollene Rotunda) 06/08/21 ?- BMP 05/16/21 reviewed and showed sodium 138, potassium 3.7, creatinine 1.29 and GFR >60 ? ? ?Medication bottles reviewed.  ? ?Return in 1 month, sooner if needed.  ? ? ?

## 2021-07-04 ENCOUNTER — Encounter: Payer: Self-pay | Admitting: Family

## 2021-07-04 ENCOUNTER — Telehealth: Payer: Self-pay

## 2021-07-04 NOTE — Telephone Encounter (Signed)
-----   Message from Alisa Graff, [place] sent at 07/04/2021  8:21 AM EDT ----- ?Potassium level looks good. Kidney function has declined slightly but yesterday I had already decreased the furosemide to 1/2 tablet daily which will help with the kidney function. We will recheck labs at the next visit in 1 month.  ?

## 2021-08-03 ENCOUNTER — Encounter: Payer: Self-pay | Admitting: Family

## 2021-08-03 ENCOUNTER — Other Ambulatory Visit
Admission: RE | Admit: 2021-08-03 | Discharge: 2021-08-03 | Disposition: A | Discharge status: Home / Self Care | Payer: Self-pay | Source: Ambulatory Visit | Attending: Family | Admitting: Family

## 2021-08-03 ENCOUNTER — Ambulatory Visit: Payer: Self-pay | Attending: Family | Admitting: Family

## 2021-08-03 VITALS — BP 131/96 | HR 66 | Resp 16 | Ht 75.0 in | Wt 342.1 lb

## 2021-08-03 DIAGNOSIS — I10 Essential (primary) hypertension: Secondary | ICD-10-CM

## 2021-08-03 DIAGNOSIS — M7989 Other specified soft tissue disorders: Secondary | ICD-10-CM | POA: Insufficient documentation

## 2021-08-03 DIAGNOSIS — I5022 Chronic systolic (congestive) heart failure: Secondary | ICD-10-CM

## 2021-08-03 DIAGNOSIS — E785 Hyperlipidemia, unspecified: Secondary | ICD-10-CM | POA: Insufficient documentation

## 2021-08-03 DIAGNOSIS — Z8249 Family history of ischemic heart disease and other diseases of the circulatory system: Secondary | ICD-10-CM | POA: Insufficient documentation

## 2021-08-03 DIAGNOSIS — I110 Hypertensive heart disease with heart failure: Secondary | ICD-10-CM | POA: Insufficient documentation

## 2021-08-03 DIAGNOSIS — Z79899 Other long term (current) drug therapy: Secondary | ICD-10-CM | POA: Insufficient documentation

## 2021-08-03 LAB — BASIC METABOLIC PANEL
Anion gap: 3 — ABNORMAL LOW (ref 5–15)
BUN: 18 mg/dL (ref 6–20)
CO2: 30 mmol/L (ref 22–32)
Calcium: 9.3 mg/dL (ref 8.9–10.3)
Chloride: 107 mmol/L (ref 98–111)
Creatinine, Ser: 1.19 mg/dL (ref 0.61–1.24)
GFR, Estimated: >60 mL/min (ref >60)
Glucose, Bld: 89 mg/dL (ref 70–99)
Potassium: 4.1 mmol/L (ref 3.5–5.1)
Sodium: 140 mmol/L (ref 135–145)

## 2021-08-03 MED ORDER — ISOSORB DINITRATE-HYDRALAZINE 20-37.5 MG PO TABS: 1.0000 | ORAL_TABLET | Freq: Three times a day (TID) | ORAL | 3 refills | Status: DC

## 2021-08-03 NOTE — Progress Notes (Signed)
[facility] - HEART FAILURE CLINIC - PHARMACIST COUNSELING NOTE  Guideline-Directed Medical Therapy/Evidence Based Medicine  ACE/ARB/ARNI: Sacubitril-valsartan 24-26 mg twice daily Beta Blocker: Metoprolol succinate 50 mg daily Aldosterone Antagonist: None Diuretic: Furosemide 20 mg daily SGLT2i: Dapagliflozin 10 mg daily  Adherence Assessment  Do you ever forget to take your medication? [] Yes [x] No  Do you ever skip doses due to side effects? [] Yes [x] No  Do you have trouble affording your medicines? [] Yes [x] No  Are you ever unable to pick up your medication due to transportation difficulties? [] Yes [x] No  Do you ever stop taking your medications because you don't believe they are helping? [] Yes [x] No  Do you check your weight daily? [] Yes [x] No   Adherence strategy: takes medications before work  Barriers to obtaining medications: none  Vital signs: HR 66, BP 131/96, weight (pounds) 342 lb ECHO: Date 05/15/2021, EF 45-50%, notes moderate left ventricular hypertrophy     Latest Ref Rng & Units 07/03/2021    2:39 PM 05/16/2021    5:50 AM 05/15/2021    2:31 PM  BMP  Glucose 70 - 99 mg/dL 87     BUN 6 - 20 mg/dL 18  22  21    Creatinine 0.61 - 1.24 mg/dL     Sodium 135 - 145 mmol/L 138  138  139   Potassium 3.5 - 5.1 mmol/L 4.1  3.7  3.8   Chloride 98 - 111 mmol/L 106  102  102   CO2 22 - 32 mmol/L 25  29  27    Calcium 8.9 - 10.3 mg/dL 9.2  9.5  9.5     Past Medical History:  Diagnosis Date   Arthritis    CHF (congestive heart failure) (HCC)    Hyperlipidemia    Hypertension    Obesity, Class III, BMI 40-49.9 (morbid obesity) (HCC)     ASSESSMENT 41 year old male who presents to the HF clinic for follow up. Checks blood pressure at home with reported readings in the 130's, but never over 140. Reports weight consistently around 340 lb. Reports never missing a dose of medications. Took hydralazine while waiting for  appointment to begin. Blood pressure in clinic (which was taken several minutes patient administered hydralazine dose) is near goal of 130/80. Patient inquires about . It is time to refill his medication but he is unsure of cost.   PLAN Continue current GDMT Reconciled medications. Provided Rx savings card. Advised patient to sign up via the website or phone number provided. Gave patient a 7 day supply of Farxiga to take while awaiting for Rx savings card activation.  To reduce pill burden and reduce the number of administrations per day, switch to Bidil 20-37.5 BID after completing supply of isosorbide mononitrate and hydralazine. Educated patient on each of these medication components. Provided Rx savings card and free 30-day coupon.   Time spent: 15 minutes  , PharmD Pharmacy Resident  08/03/2021 3:40 PM  Current Outpatient Medications:    aspirin 81 MG tablet, Take 81 mg by mouth daily., Disp: , Rfl:    atorvastatin (LIPITOR) 40 MG tablet, Take 1 tablet (40 mg total) by mouth at bedtime., Disp: 30 tablet, Rfl: 2   dapagliflozin propanediol (FARXIGA) 10 MG TABS tablet, Take 1 tablet (10 mg total) by mouth daily before breakfast., Disp: 30 tablet, Rfl: 5   furosemide (LASIX) 40 MG tablet, Take 1 tablet (40 mg total) by mouth daily. (Patient taking differently: Take  20 mg by mouth daily.), Disp: 30 tablet, Rfl: 2   isosorbide-hydrALAZINE (BIDIL) 20-37.5 MG tablet, Take 1 tablet by mouth 3 (three) times daily., Disp: 90 tablet, Rfl: 3   metoprolol succinate (TOPROL-XL) 50 MG 24 hr tablet, Take 1 tablet (50 mg total) by mouth daily. TAKE 1 TABLET BY MOUTH ONCE DAILY (LAST  REFILL,  NEEDS  FOLLOW  UP), Disp: 30 tablet, Rfl: 2   potassium chloride SA (KLOR-CON M) 20 MEQ tablet, Take 2 tablets (40 mEq total) by mouth 2 (two) times daily. (Patient taking differently: Take 40 mEq by mouth daily.), Disp: 120 tablet, Rfl: 5   sacubitril-valsartan (ENTRESTO) 24-26 MG, Take  1 tablet by mouth 2 (two) times daily., Disp: 60 tablet, Rfl: 3   COUNSELING POINTS/CLINICAL PEARLS  DRUGS TO CAUTION IN HEART FAILURE  Drug or Class Mechanism  Analgesics NSAIDs COX-2 inhibitors Glucocorticoids  Sodium and water retention, increased systemic vascular resistance, decreased response to diuretics   Diabetes Medications Metformin Thiazolidinediones Rosiglitazone (Avandia) Pioglitazone (Actos) DPP4 Inhibitors Saxagliptin (Onglyza) Sitagliptin (Januvia)   Lactic acidosis Possible calcium channel blockade   Unknown  Antiarrhythmics Class I  Flecainide Disopyramide Class III Sotalol Other Dronedarone  Negative inotrope, proarrhythmic   Proarrhythmic, beta blockade  Negative inotrope  Antihypertensives Alpha Blockers Doxazosin Calcium Channel Blockers Diltiazem Verapamil Nifedipine Central Alpha Adrenergics Moxonidine Peripheral Vasodilators Minoxidil  Increases renin and aldosterone  Negative inotrope    Possible sympathetic withdrawal  Unknown  Anti-infective Itraconazole Amphotericin B  Negative inotrope Unknown  Hematologic Anagrelide Cilostazol   Possible inhibition of PD IV Inhibition of PD III causing arrhythmias  Neurologic/Psychiatric Stimulants Anti-Seizure Drugs Carbamazepine Pregabalin Antidepressants Tricyclics Citalopram Parkinsons Bromocriptine Pergolide Pramipexole Antipsychotics Clozapine Antimigraine Ergotamine Methysergide Appetite suppressants Bipolar Lithium  Peripheral alpha and beta agonist activity  Negative inotrope and chronotrope Calcium channel blockade  Negative inotrope, proarrhythmic Dose-dependent QT prolongation  Excessive serotonin activity/valvular damage Excessive serotonin activity/valvular damage Unknown  IgE mediated hypersensitivy, calcium channel blockade  Excessive serotonin activity/valvular damage Excessive serotonin activity/valvular damage Valvular  damage  Direct myofibrillar degeneration, adrenergic stimulation  Antimalarials Chloroquine Hydroxychloroquine Intracellular inhibition of lysosomal enzymes  Urologic Agents Alpha Blockers Doxazosin Prazosin Tamsulosin Terazosin  Increased renin and aldosterone  Adapted from Page Williemae Natter, et al. "Drugs That May Cause or Exacerbate Heart Failure: A Scientific Statement from the American Heart  Association." Circulation 2016; 134:e32-e69. DOI: 10.1161/CIR.0000000000000426   MEDICATION ADHERENCES TIPS AND STRATEGIES Taking medication as prescribed improves patient outcomes in heart failure (reduces hospitalizations, improves symptoms, increases survival) Side effects of medications can be managed by decreasing doses, switching agents, stopping drugs, or adding additional therapy. Please let someone in the Heart Failure Clinic know if you have having bothersome side effects so we can modify your regimen. Do not alter your medication regimen without talking to [place].  Medication reminders can help patients remember to take drugs on time. If you are missing or forgetting doses you can try linking behaviors, using pill boxes, or an electronic reminder like an alarm on your phone or an app. Some people can also get automated phone calls as medication reminders.

## 2021-08-03 NOTE — Patient Instructions (Addendum)
Continue weighing daily and call for an overnight weight gain of 3 pounds or more or a weekly weight gain of more than 5 pounds.   If you have voicemail, please make sure your mailbox is cleaned out so that we may leave a message and please make sure to listen to any voicemails.    If you receive a satisfaction survey regarding the Heart Failure Clinic, please take the time to fill it out. This way we can continue to provide excellent care and make any changes that need to be made.    Finish taking your hydralazine and isosorbide. Once you finish these, you will start bidil as 1 tablet three times a day. Bidil has both hydralazine and isosorbide in 1 tablet. Take the coupon to the pharmacy and you will get the first 30 days free.

## 2021-08-03 NOTE — Progress Notes (Signed)
Patient ID: John Lowe, male    DOB: 1980/10/31, 41 y.o.   MRN: [id number]  HPI  John Lowe is Lowe 41 y/o male with Lowe history of hyperlipidemia, HTN, obesity and chronic heart failure.   Echo report from 05/15/21 reviewed and showed an EF of 45-50% along with moderate LVH and severe LAE.   Admitted 05/14/21 due to shortness of breath due to acute HF. Cardiology consult obtained. Elevated troponin thought to be due to demand ischemia. Medications started for HTN. Hypokalemia corrected. Discharged after 2 days.   He presents today for Lowe follow-up visit with Lowe chief complaint of minimal fatigue upon moderate exertion. He has no other symptoms and specifically denies any dizziness, difficulty sleeping, cough, shortness of breath, chest pain, pedal edema, palpitations, abdominal distention or weight gain.   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 Does notice some swelling in his legs at the end of this shift.   Past Medical History:  Diagnosis Date   Arthritis    CHF (congestive heart failure) (HCC)    Hyperlipidemia    Hypertension    Obesity, Class III, BMI 40-49.9 (morbid obesity) (HCC)    Past Surgical History:  Procedure Laterality Date   ANKLE SURGERY     rotator cuff Right    Family History  Problem Relation Age of Onset   Hypertension Mother    Hypertension Father    Heart disease Father    Kidney disease Father    Hypertension Sister    Hypertension Brother    Cancer Maternal Grandmother        breast   Cancer Maternal Grandfather        bone cancer   Hypertension Paternal Grandmother    Social History   Tobacco Use   Smoking status: Never   Smokeless tobacco: Never  Substance Use Topics   Alcohol use: No   No Known Allergies  Prior to Admission medications   Medication Sig Start Date End Date Taking? Authorizing Provider  atorvastatin (LIPITOR) 40 MG tablet Take 1 tablet (40 mg total) by mouth at bedtime. 05/16/21  Yes John Shipper, MD  dapagliflozin propanediol (FARXIGA) 10 MG TABS tablet Take 1 tablet (10 mg total) by mouth daily before breakfast. 07/03/21  Yes John Kindred A, FNP  furosemide (LASIX) 40 MG tablet Take 1 tablet (40 mg total) by mouth daily. Patient taking differently: Take 20 mg by mouth daily. 05/17/21  Yes John Shipper, MD  hydrALAZINE (APRESOLINE) 25 MG tablet Take 1 tablet (25 mg total) by mouth 3 (three) times daily. 05/16/21  Yes John Shipper, MD  isosorbide mononitrate (IMDUR) 30 MG 24 hr tablet Take 1 tablet (30 mg total) by mouth daily. 05/17/21  Yes John Shipper, MD  metoprolol succinate (TOPROL-XL) 50 MG 24 hr tablet Take 1 tablet (50 mg total) by mouth daily. TAKE 1 TABLET BY MOUTH ONCE DAILY (LAST  REFILL,  NEEDS  FOLLOW  UP) 05/16/21  Yes John Shipper, MD  potassium chloride SA (KLOR-CON M) 20 MEQ tablet Take 2 tablets (40 mEq total) by mouth 2 (two) times daily. Patient taking differently: Take 40 mEq by mouth daily. 05/29/21  Yes John Reddy A, FNP  sacubitril-valsartan (ENTRESTO) 24-26 MG Take 1 tablet by mouth 2 (two) times daily. 05/24/21  Yes John Kindred A, FNP  aspirin 81 MG tablet Take 81 mg by mouth daily.    [provider]   Review of Systems  Constitutional:  Positive  for fatigue. Negative for appetite change.  HENT:  Negative for congestion, postnasal drip and sore throat.   Eyes: Negative.   Respiratory:  Negative for cough, chest tightness and shortness of breath.   Cardiovascular:  Negative for chest pain, palpitations and leg swelling.  Gastrointestinal:  Negative for abdominal distention and abdominal pain.  Endocrine: Negative.   Genitourinary: Negative.   Musculoskeletal:  Negative for back pain and neck pain.  Skin: Negative.   Allergic/Immunologic: Negative.   Neurological:  Negative for dizziness and light-headedness.  Hematological:  Negative for adenopathy. Does not bruise/bleed easily.  Psychiatric/Behavioral:  Negative for dysphoric mood  and sleep disturbance (sleeping on 2 pillows). The patient is not nervous/anxious.    Vitals:   08/03/21 1413  BP: (!) 131/96  Pulse: 66  Resp: 16  SpO2: 99%  Weight: (!) 342 lb 2 oz (155.2 kg)  Height: 6\' 3"  (1.905 m)   Wt Readings from Last 3 Encounters:  08/03/21 (!) 342 lb 2 oz (155.2 kg)  07/03/21 (!) 344 lb (156 kg)  06/08/21 (!) 344 lb (156 kg)   Lab Results  Component Value Date   CREATININE 1.19 08/03/2021   CREATININE 1.36 (H) 07/03/2021   CREATININE 1.29 (H) 05/16/2021   Physical Exam Vitals and nursing note reviewed.  Constitutional:      Appearance: Normal appearance.  HENT:     Head: Normocephalic and atraumatic.  Cardiovascular:     Rate and Rhythm: Normal rate and regular rhythm.  Pulmonary:     Effort: Pulmonary effort is normal. No respiratory distress.     Breath sounds: No wheezing or rales.  Abdominal:     General: There is no distension.     Palpations: Abdomen is soft.     Tenderness: There is no abdominal tenderness.  Musculoskeletal:        General: No tenderness.     Cervical back: Normal range of motion and neck supple.     Right lower leg: No edema.     Left lower leg: No edema.  Skin:    General: Skin is warm and dry.  Neurological:     General: No focal deficit present.     Mental Status: He is alert and oriented to person, place, and time.  Psychiatric:        Mood and Affect: Mood normal.        Behavior: Behavior normal.        Thought Content: Thought content normal.   Assessment & Plan:  1: Chronic heart failure with mildly reduced ejection fraction- - NYHA class II - euvolemic today - weighing daily; reminded to call for an overnight weight gain of > 2 pounds or Lowe weekly weight gain of > 5 pounds - weight down 2 pounds from last visit here 1 month ago - not adding salt and has been reading food labels for sodium content; reviewed the importance of keeping daily sodium to < 2000mg  / day - keeping his daily fluid intake to  between 60-64 ounces - on GDMT of entresto, metoprolol & farxiga - check BMP today since farxiga was added at last visit - he will finish his isosorbide/ hydralazine and then begin bidil 20/37.5mg  TID; 30 day bidil voucher provided along with commercial copay card [facility] - BNP 05/14/21 was 286.7 - PharmD reconciled medications with the patient  2: HTN- - BP mildly elevated (131/96) and he just took noon dose of hydralazine while in the room  -  saw PCP Suzie Portela) 06/08/21 - BMP 07/03/21 reviewed and showed sodium 138, potassium 4.1, creatinine 1.36 and GFR >60   Medication bottles reviewed.   Return in 1 month, sooner if needed.

## 2021-08-16 ENCOUNTER — Other Ambulatory Visit: Payer: Self-pay | Admitting: Family Medicine

## 2021-08-16 NOTE — Telephone Encounter (Signed)
Medication Refill - Medication: metoprolol succinate (TOPROL-XL) 50 MG 24 hr tablet, atorvastatin (LIPITOR) 40 MG tablet 30 tablet  Please note that both meds are from [facility], however he had called in last wk to [facility] for med refills and called back today for status and was told that the dr is on vacation and he needs to just call his PCP. Pt states he did not think that was correct but that was what he was told. He is out of medication  Has the patient contacted their pharmacy? Yes.   (Agent: If no, request that the patient contact the pharmacy for the refill. If patient does not wish to contact the pharmacy document the reason why and proceed with request.) (Agent: If yes, when and what did the pharmacy advise?) call dr   Preferred Pharmacy (with phone number or street name):  [facility] [address] [place] - [address]  [address] [place] [address]  Phone: [phone] Fax: [phone]   Has the patient been seen for an appointment in the last year OR does the patient have an upcoming appointment? Yes.    Agent: Please be advised that RX refills may take up to 3 business days. We ask that you follow-up with your pharmacy.  [phone] Call pt to advise if this cannot be done.

## 2021-08-17 ENCOUNTER — Other Ambulatory Visit: Payer: Self-pay | Admitting: Family Medicine

## 2021-08-17 MED ORDER — METOPROLOL SUCCINATE ER 50 MG PO TB24: 50.0000 mg | ORAL_TABLET | Freq: Every day | ORAL | 1 refills | Status: DC

## 2021-08-17 MED ORDER — ATORVASTATIN CALCIUM 40 MG PO TABS: 40.0000 mg | ORAL_TABLET | Freq: Every day | ORAL | 1 refills | Status: DC

## 2021-08-17 NOTE — Telephone Encounter (Signed)
Requested medication (s) are due for refill today: yes  Requested medication (s) are on the active medication list: yes    Last refill: 05/16/21  #30  2 refills  Future visit scheduled yes  12/05/21  Notes to clinic: Historical Provider      Please note that both meds are from [facility], however he had called in last wk to [facility] for med refills and called back today for status and was told that the dr is on vacation and he needs to just call his PCP. Pt states he did not think that was correct but that was what he was told. He is out of medication   Requested Prescriptions  Pending Prescriptions Disp Refills   atorvastatin (LIPITOR) 40 MG tablet 30 tablet 2    Sig: Take 1 tablet (40 mg total) by mouth at bedtime.     Cardiovascular:  Antilipid - Statins Failed - 08/16/2021  2:51 PM      Failed - Lipid Panel in normal range within the last 12 months    Cholesterol, Total  Date Value Ref Range Status  09/23/2014 208 (H) 100 - 199 mg/dL Final   Cholesterol  Date Value Ref Range Status  05/16/2021 209 (H) 0 - 200 mg/dL Final   LDL Cholesterol (Calc)  Date Value Ref Range Status  03/10/2018 176 (H) mg/dL (calc) Final    Comment:    Reference range: <100 . Desirable range <100 mg/dL for primary prevention;   <70 mg/dL for patients with CHD or diabetic patients  with > or = 2 CHD risk factors. Marland Kitchen LDL-C is now calculated using the Martin-Hopkins  calculation, which is a validated novel method providing  better accuracy than the Friedewald equation in the  estimation of LDL-C.  Horald Pollen et al. Lenox Ahr. 2876;811(57): 2061-2068  (http://education.[facility].com/faq/FAQ164)    LDL Cholesterol  Date Value Ref Range Status  05/16/2021 151 (H) 0 - 99 mg/dL Final    Comment:           Total Cholesterol/HDL:CHD Risk Coronary Heart Disease Risk Table                     Men   Women  1/2 Average Risk   3.4   3.3  Average Risk       5.0   4.4  2 X Average Risk   9.6   7.1  3  X Average Risk  23.4   11.0        Use the calculated Patient Ratio above and the CHD Risk Table to determine the patient's CHD Risk.        ATP III CLASSIFICATION (LDL):  <100     mg/dL   Optimal  262-035  mg/dL   Near or Above                    Optimal  130-159  mg/dL   Borderline  597-416  mg/dL   High  >384     mg/dL   Very High Performed at [facility], [address] [place], [place] [address]    HDL  Date Value Ref Range Status  05/16/2021 41 >40 mg/dL Final  80/32/1224 46 >82 mg/dL Final    Comment:    According to ATP-III Guidelines, HDL-C >59 mg/dL is considered a negative risk factor for CHD.    Triglycerides  Date Value Ref Range Status  05/16/2021 83 <150 mg/dL Final  Passed - Patient is not pregnant      Passed - Valid encounter within last 12 months    Recent Outpatient Visits           2 months ago Hospital discharge follow-up   [facility] [name], FNP   3 years ago Essential hypertension   [facility] Lada, Janit Bern, MD   4 years ago Essential hypertension   [facility] Lada, Janit Bern, MD   5 years ago Mixed hyperlipidemia   [facility] Lada, Janit Bern, MD   5 years ago Pharyngitis, unspecified etiology   [facility], Janit Bern, MD       Future Appointments             In 3 months [name], FNP [facility]             metoprolol succinate (TOPROL-XL) 50 MG 24 hr tablet 30 tablet 2    Sig: Take 1 tablet (50 mg total) by mouth daily. TAKE 1 TABLET BY MOUTH ONCE DAILY (LAST  REFILL,  NEEDS  FOLLOW  UP)     Cardiovascular:  Beta Blockers Failed - 08/16/2021  2:51 PM      Failed - Last BP in normal range    BP Readings from Last 1 Encounters:  08/03/21 (!) 131/96         Passed - Last Heart Rate in normal range    Pulse Readings from Last 1 Encounters:  08/03/21 66          Passed - Valid encounter within last 6 months    Recent Outpatient Visits           2 months ago Hospital discharge follow-up   [facility] [name], FNP   3 years ago Essential hypertension   [facility] Lada, Janit Bern, MD   4 years ago Essential hypertension   [facility] Lada, Janit Bern, MD   5 years ago Mixed hyperlipidemia   [facility] Lada, Janit Bern, MD   5 years ago Pharyngitis, unspecified etiology   [facility] Lada, Janit Bern, MD       Future Appointments             In 3 months [name], FNP [facility]

## 2021-09-03 NOTE — Progress Notes (Unsigned)
Patient ID: John Lowe, male    DOB: May 17, 1980, 41 y.o.   MRN: [id number]  HPI  John Lowe is a 41 y/o male with a history of hyperlipidemia, HTN, obesity and chronic heart failure.   Echo report from 05/15/21 reviewed and showed an EF of 45-50% along with moderate LVH and severe LAE.   Admitted 05/14/21 due to shortness of breath due to acute HF. Cardiology consult obtained. Elevated troponin thought to be due to demand ischemia. Medications started for HTN. Hypokalemia corrected. Discharged after 2 days.   He presents today for a follow-up visit with a chief complaint of minimal fatigue upon moderate exertion. Describes this as chronic in nature. He has no other symptoms and specifically denies any dizziness, difficulty sleeping, abdominal distention, palpitations, pedal edema, chest pain, shortness of breath, cough or weight gain.   Was off work today and admits that his schedule was messed up today so he just took his midday dose of bidil while in the office.   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 Does notice some swelling in his legs at the end of this shift.   Is going on a cruise to the [place] mid-August. Says this is his 13th cruise.   Past Medical History:  Diagnosis Date   Arthritis    CHF (congestive heart failure) (HCC)    Hyperlipidemia    Hypertension    Obesity, Class III, BMI 40-49.9 (morbid obesity) (HCC)    Past Surgical History:  Procedure Laterality Date   ANKLE SURGERY     rotator cuff Right    Family History  Problem Relation Age of Onset   Hypertension Mother    Hypertension Father    Heart disease Father    Kidney disease Father    Hypertension Sister    Hypertension Brother    Cancer Maternal Grandmother        breast   Cancer Maternal Grandfather        bone cancer   Hypertension Paternal Grandmother    Social History   Tobacco Use   Smoking status: Never   Smokeless tobacco: Never  Substance Use Topics    Alcohol use: No   No Known Allergies  Prior to Admission medications   Medication Sig Start Date End Date Taking? Authorizing Provider  aspirin 81 MG tablet Take 81 mg by mouth daily.   Yes [provider]  atorvastatin (LIPITOR) 40 MG tablet Take 1 tablet (40 mg total) by mouth at bedtime. 08/17/21  Yes [name], FNP  dapagliflozin propanediol (FARXIGA) 10 MG TABS tablet Take 1 tablet (10 mg total) by mouth daily before breakfast. 07/03/21  Yes [name], FNP  furosemide (LASIX) 40 MG tablet Take 1 tablet (40 mg total) by mouth daily. Patient taking differently: Take 20 mg by mouth daily. 05/17/21  Yes [name], MD  isosorbide-hydrALAZINE (BIDIL) 20-37.5 MG tablet Take 1 tablet by mouth 3 (three) times daily. 08/03/21  Yes [name], FNP  metoprolol succinate (TOPROL-XL) 50 MG 24 hr tablet Take 1 tablet (50 mg total) by mouth daily. Take with or immediately following a meal. 08/17/21  Yes [name], FNP  potassium chloride SA (KLOR-CON M) 20 MEQ tablet Take 2 tablets (40 mEq total) by mouth 2 (two) times daily. Patient taking differently: Take 40 mEq by mouth daily. 05/29/21  Yes [name], FNP  sacubitril-valsartan (ENTRESTO) 24-26 MG Take 1 tablet by mouth 2 (two)  times daily. 05/24/21  Yes [name], FNP   Review of Systems  Constitutional:  Positive for fatigue. Negative for appetite change.  HENT:  Negative for congestion, postnasal drip and sore throat.   Eyes: Negative.   Respiratory:  Negative for cough, chest tightness and shortness of breath.   Cardiovascular:  Negative for chest pain, palpitations and leg swelling.  Gastrointestinal:  Negative for abdominal distention and abdominal pain.  Endocrine: Negative.   Genitourinary: Negative.   Musculoskeletal:  Negative for back pain and neck pain.  Skin: Negative.   Allergic/Immunologic: Negative.   Neurological:  Negative for dizziness and light-headedness.  Hematological:  Negative for  adenopathy. Does not bruise/bleed easily.  Psychiatric/Behavioral:  Negative for dysphoric mood and sleep disturbance (sleeping on 2 pillows). The patient is not nervous/anxious.    Vitals:   09/04/21 1409  BP: (!) 146/90  Pulse: (!) 57  Resp: 18  SpO2: 100%  Weight: (!) 339 lb (153.8 kg)  Height: 6\' 3"  (1.905 m)   Wt Readings from Last 3 Encounters:  09/04/21 (!) 339 lb (153.8 kg)  08/03/21 (!) 342 lb 2 oz (155.2 kg)  07/03/21 (!) 344 lb (156 kg)   Lab Results  Component Value Date   CREATININE 1.19 08/03/2021   CREATININE 1.36 (H) 07/03/2021   CREATININE 1.29 (H) 05/16/2021   Physical Exam Vitals and nursing note reviewed.  Constitutional:      Appearance: Normal appearance.  HENT:     Head: Normocephalic and atraumatic.  Cardiovascular:     Rate and Rhythm: Normal rate and regular rhythm.  Pulmonary:     Effort: Pulmonary effort is normal. No respiratory distress.     Breath sounds: No wheezing or rales.  Abdominal:     General: There is no distension.     Palpations: Abdomen is soft.     Tenderness: There is no abdominal tenderness.  Musculoskeletal:        General: No tenderness.     Cervical back: Normal range of motion and neck supple.     Right lower leg: No edema.     Left lower leg: No edema.  Skin:    General: Skin is warm and dry.  Neurological:     General: No focal deficit present.     Mental Status: He is alert and oriented to person, place, and time.  Psychiatric:        Mood and Affect: Mood normal.        Behavior: Behavior normal.        Thought Content: Thought content normal.   Assessment & Plan:  1: Chronic heart failure with mildly reduced ejection fraction- - NYHA class II - euvolemic today - weighing daily; reminded to call for an overnight weight gain of > 2 pounds or a weekly weight gain of > 5 pounds - weight down 3 pounds from last visit here 1 month ago - not adding salt and has been reading food labels for sodium content;  reviewed the importance of keeping daily sodium to < 2000mg  / day - emphasized to watch his sodium intake when he's on his cruise later next month - keeping his daily fluid intake to between 60-64 ounces - on GDMT of entresto, metoprolol & farxiga - bidil started at last visit - consider adding spironolactone - [facility] cardiology saw patient during admission - BNP 05/14/21 was 286.7  2: HTN- - BP mildly elevated (146/90) but he just took his midday bidil while in the office -  saw PCP Suzie Portela) 06/08/21 - BMP 08/03/21 reviewed and showed sodium 140, potassium 4.1, creatinine 1.19 and GFR >60   Medication bottles reviewed.   Return in 2 months, sooner if needed.

## 2021-09-04 ENCOUNTER — Encounter: Payer: Self-pay | Admitting: Family

## 2021-09-04 ENCOUNTER — Ambulatory Visit: Payer: Self-pay | Attending: Family | Admitting: Family

## 2021-09-04 VITALS — BP 146/90 | HR 57 | Resp 18 | Ht 75.0 in | Wt 339.0 lb

## 2021-09-04 DIAGNOSIS — Z7984 Long term (current) use of oral hypoglycemic drugs: Secondary | ICD-10-CM | POA: Insufficient documentation

## 2021-09-04 DIAGNOSIS — M7989 Other specified soft tissue disorders: Secondary | ICD-10-CM | POA: Insufficient documentation

## 2021-09-04 DIAGNOSIS — Z7901 Long term (current) use of anticoagulants: Secondary | ICD-10-CM | POA: Insufficient documentation

## 2021-09-04 DIAGNOSIS — I509 Heart failure, unspecified: Secondary | ICD-10-CM | POA: Insufficient documentation

## 2021-09-04 DIAGNOSIS — Z6841 Body Mass Index (BMI) 40.0 and over, adult: Secondary | ICD-10-CM | POA: Insufficient documentation

## 2021-09-04 DIAGNOSIS — I10 Essential (primary) hypertension: Secondary | ICD-10-CM

## 2021-09-04 DIAGNOSIS — I5022 Chronic systolic (congestive) heart failure: Secondary | ICD-10-CM

## 2021-09-04 DIAGNOSIS — E785 Hyperlipidemia, unspecified: Secondary | ICD-10-CM | POA: Insufficient documentation

## 2021-09-04 DIAGNOSIS — I110 Hypertensive heart disease with heart failure: Secondary | ICD-10-CM | POA: Insufficient documentation

## 2021-09-04 DIAGNOSIS — R5383 Other fatigue: Secondary | ICD-10-CM | POA: Insufficient documentation

## 2021-09-04 DIAGNOSIS — Z79899 Other long term (current) drug therapy: Secondary | ICD-10-CM | POA: Insufficient documentation

## 2021-09-04 NOTE — Patient Instructions (Signed)
Continue weighing daily and call for an overnight weight gain of 3 pounds or more or a weekly weight gain of more than 5 pounds.   If you have voicemail, please make sure your mailbox is cleaned out so that we may leave a message and please make sure to listen to any voicemails.     

## 2021-09-14 ENCOUNTER — Other Ambulatory Visit: Payer: Self-pay | Admitting: Family

## 2021-09-14 ENCOUNTER — Other Ambulatory Visit: Payer: Self-pay | Admitting: Family Medicine

## 2021-09-14 MED ORDER — FUROSEMIDE 40 MG PO TABS: 20.0000 mg | ORAL_TABLET | Freq: Every day | ORAL | 3 refills | Status: DC

## 2021-09-14 NOTE — Telephone Encounter (Signed)
Medication Refill - Medication: furosemide (LASIX) 40 MG tablet  Has been out for two days   Has the patient contacted their pharmacy? Yes.   (Agent: If no, request that the patient contact the pharmacy for the refill. If patient does not wish to contact the pharmacy document the reason why and proceed with request.) (Agent: If yes, when and what did the pharmacy advise?)  Preferred Pharmacy (with phone number or street name):  [facility] [address], [place] - [address]  [address] [place] [address]  Phone: [phone] Fax: [phone]   Has the patient been seen for an appointment in the last year OR does the patient have an upcoming appointment? Yes.    Agent: Please be advised that RX refills may take up to 3 business days. We ask that you follow-up with your pharmacy.

## 2021-10-18 ENCOUNTER — Other Ambulatory Visit: Payer: Self-pay | Admitting: Family

## 2021-11-06 ENCOUNTER — Encounter: Payer: Self-pay | Admitting: Family

## 2021-11-06 ENCOUNTER — Ambulatory Visit: Payer: Self-pay | Attending: Family | Admitting: Family

## 2021-11-06 VITALS — BP 143/98 | HR 84 | Resp 20 | Ht 75.0 in | Wt 346.5 lb

## 2021-11-06 DIAGNOSIS — E785 Hyperlipidemia, unspecified: Secondary | ICD-10-CM | POA: Insufficient documentation

## 2021-11-06 DIAGNOSIS — I110 Hypertensive heart disease with heart failure: Secondary | ICD-10-CM | POA: Insufficient documentation

## 2021-11-06 DIAGNOSIS — I5022 Chronic systolic (congestive) heart failure: Secondary | ICD-10-CM

## 2021-11-06 DIAGNOSIS — I10 Essential (primary) hypertension: Secondary | ICD-10-CM

## 2021-11-06 DIAGNOSIS — Z7984 Long term (current) use of oral hypoglycemic drugs: Secondary | ICD-10-CM | POA: Insufficient documentation

## 2021-11-06 DIAGNOSIS — Z7901 Long term (current) use of anticoagulants: Secondary | ICD-10-CM | POA: Insufficient documentation

## 2021-11-06 DIAGNOSIS — I509 Heart failure, unspecified: Secondary | ICD-10-CM | POA: Insufficient documentation

## 2021-11-06 NOTE — Progress Notes (Signed)
Patient ID: John Lowe, male    DOB: 29-Jul-1980, 41 y.o.   MRN: [id number]  HPI  Mr Gunderson is a 41 y/o male with a history of hyperlipidemia, HTN, obesity and chronic heart failure.   Echo report from 05/15/21 reviewed and showed an EF of 45-50% along with moderate LVH and severe LAE.   Admitted 05/14/21 due to shortness of breath due to acute HF. Cardiology consult obtained. Elevated troponin thought to be due to demand ischemia. Medications started for HTN. Hypokalemia corrected. Discharged after 2 days.   He presents today for a follow-up visit with no complaints.  He has no other symptoms and specifically denies any dizziness, headaches, cough, SOB, Chest pain/pressure, palpitations, abdominal distention, swelling in lower extremities, nor difficulty sleeping.   Following a low-sodium diet and low cholesterol not adding salt to foods. Weighing daily and keeping log. Doesn't really notice swelling in the legs. He says weight is up due to cruise and eating too much.   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 Does notice some swelling in his legs at the end of this shift.   Past Medical History:  Diagnosis Date   Arthritis    CHF (congestive heart failure) (HCC)    Hyperlipidemia    Hypertension    Obesity, Class III, BMI 40-49.9 (morbid obesity) (HCC)    Past Surgical History:  Procedure Laterality Date   ANKLE SURGERY     rotator cuff Right    Family History  Problem Relation Age of Onset   Hypertension Mother    Hypertension Father    Heart disease Father    Kidney disease Father    Hypertension Sister    Hypertension Brother    Cancer Maternal Grandmother        breast   Cancer Maternal Grandfather        bone cancer   Hypertension Paternal Grandmother    Social History   Tobacco Use   Smoking status: Never   Smokeless tobacco: Never  Substance Use Topics   Alcohol use: No   No Known Allergies  Prior to Admission medications    Medication Sig Start Date End Date Taking? Authorizing Provider  aspirin 81 MG tablet Take 81 mg by mouth daily.   Yes [provider]  atorvastatin (LIPITOR) 40 MG tablet Take 1 tablet (40 mg total) by mouth at bedtime. 08/17/21  Yes [name], FNP  dapagliflozin propanediol (FARXIGA) 10 MG TABS tablet Take 1 tablet (10 mg total) by mouth daily before breakfast. 07/03/21  Yes [name], FNP  ENTRESTO 24-26 MG Take 1 tablet by mouth twice daily 10/18/21  Yes [name], FNP  furosemide (LASIX) 40 MG tablet Take 0.5 tablets (20 mg total) by mouth daily. And additional 1/2 tablet if needed for weight gain or swelling 09/14/21  Yes Hackney, Tina A, FNP  isosorbide-hydrALAZINE (BIDIL) 20-37.5 MG tablet Take 1 tablet by mouth 3 (three) times daily. 08/03/21  Yes [name], FNP  metoprolol succinate (TOPROL-XL) 50 MG 24 hr tablet Take 1 tablet (50 mg total) by mouth daily. Take with or immediately following a meal. 08/17/21  Yes [name], FNP  potassium chloride SA (KLOR-CON M) 20 MEQ tablet Take 2 tablets (40 mEq total) by mouth 2 (two) times daily. Patient taking differently: Take 40 mEq by mouth daily. 05/29/21  Yes [name], FNP    Review of Systems  Constitutional:  Negative for  appetite change and fatigue.  HENT:  Negative for congestion, postnasal drip and sore throat.   Eyes: Negative.   Respiratory:  Negative for cough, chest tightness and shortness of breath.   Cardiovascular:  Negative for chest pain, palpitations and leg swelling.  Gastrointestinal:  Negative for abdominal distention and abdominal pain.  Endocrine: Negative.   Genitourinary: Negative.   Musculoskeletal:  Negative for back pain and neck pain.  Skin: Negative.   Allergic/Immunologic: Negative.   Neurological:  Negative for dizziness and light-headedness.  Hematological:  Negative for adenopathy. Does not bruise/bleed easily.  Psychiatric/Behavioral:  Negative for dysphoric mood and  sleep disturbance (sleeping on 2 pillows). The patient is not nervous/anxious.    Vitals:   11/06/21 1430  BP: (!) 137/100  Pulse: 84  Resp: 20  SpO2: 99%   Wt Readings from Last 3 Encounters:  11/06/21 (!) 346 lb 8 oz (157.2 kg)  09/04/21 (!) 339 lb (153.8 kg)  08/03/21 (!) 342 lb 2 oz (155.2 kg)     Lab Results  Component Value Date   CREATININE 1.19 08/03/2021   CREATININE 1.36 (H) 07/03/2021   CREATININE 1.29 (H) 05/16/2021    Physical Exam Vitals and nursing note reviewed.  Constitutional:      Appearance: Normal appearance.  HENT:     Head: Normocephalic and atraumatic.  Cardiovascular:     Rate and Rhythm: Normal rate and regular rhythm.     Heart sounds: Normal heart sounds.  Pulmonary:     Effort: Pulmonary effort is normal. No respiratory distress.     Breath sounds: No wheezing or rales.  Abdominal:     General: There is no distension.     Palpations: Abdomen is soft.     Tenderness: There is no abdominal tenderness.  Musculoskeletal:        General: No tenderness.     Cervical back: Normal range of motion and neck supple.     Right lower leg: 1+ Edema present.     Left lower leg: 1+ Edema present.  Skin:    General: Skin is warm and dry.  Neurological:     General: No focal deficit present.     Mental Status: He is alert and oriented to person, place, and time.  Psychiatric:        Mood and Affect: Mood normal.        Behavior: Behavior normal.        Thought Content: Thought content normal.   Assessment & Plan:  1: Chronic heart failure with mildly reduced ejection fraction- - NYHA class II - euvolemic today - weighing daily; reminded to call for an overnight weight gain of > 2 pounds or a weekly weight gain of > 5 pounds - weight is up 7 pounds from last visit here 2 months ago - not adding salt and has been reading food labels for sodium content; reviewed the importance of keeping daily sodium to < 2000mg  / day - has been on his cruise to the  [place] since he was last here and admits to eating "way too much" - keeping his daily fluid intake to between 60-64 ounces - on GDMT of entresto, metoprolol & farxiga - consider adding spironolactone at next visit but he just filled a new bottle of potassium so has 2 months worth - BNP 05/14/21 was 286.7  2: HTN- - BP elevated 137/100 but then rechecked was 143/98  - saw PCP Rollene Rotunda) 06/08/21 - BMP 08/03/21 reviewed and showed sodium 140,  potassium 4.1, creatinine 1.19 and GFR >60   Medication bottles reviewed.   Return in 2 months, sooner if needed.

## 2021-11-06 NOTE — Patient Instructions (Signed)
Continue weighing daily and call for an overnight weight gain of 3 pounds or more or a weekly weight gain of more than 5 pounds.  °

## 2021-12-05 ENCOUNTER — Encounter: Payer: No Typology Code available for payment source | Admitting: Family Medicine

## 2022-01-07 NOTE — Progress Notes (Unsigned)
Patient ID: John Lowe, male    DOB: 1980-05-01, 41 y.o.   MRN: [id number]  HPI  John Lowe is a 41 y/o male with a history of hyperlipidemia, HTN, obesity and chronic heart failure.   Echo report from 05/15/21 reviewed and showed an EF of 45-50% along with moderate LVH and severe LAE.   Admitted 05/14/21 due to shortness of breath due to acute HF. Cardiology consult obtained. Elevated troponin thought to be due to demand ischemia. Medications started for HTN. Hypokalemia corrected. Discharged after 2 days.   John Lowe presents today with a chief complaint of a follow-up visit. John Lowe currently has no complaints and specifically denies any difficulty sleeping, dizziness, abdominal distention, palpitations, pedal edema, chest pain, shortness of breath, cough, fatigue or weight gain.   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 Does notice some swelling in his legs at the end of this shift.   Past Medical History:  Diagnosis Date   Arthritis    CHF (congestive heart failure) (HCC)    Hyperlipidemia    Hypertension    Obesity, Class III, BMI 40-49.9 (morbid obesity) (HCC)    Past Surgical History:  Procedure Laterality Date   ANKLE SURGERY     rotator cuff Right    Family History  Problem Relation Age of Onset   Hypertension Mother    Hypertension Father    Heart disease Father    Kidney disease Father    Hypertension Sister    Hypertension Brother    Cancer Maternal Grandmother        breast   Cancer Maternal Grandfather        bone cancer   Hypertension Paternal Grandmother    Social History   Tobacco Use   Smoking status: Never   Smokeless tobacco: Never  Substance Use Topics   Alcohol use: No   No Known Allergies  Prior to Admission medications   Medication Sig Start Date End Date Taking? Authorizing Provider  aspirin 81 MG tablet Take 81 mg by mouth daily.   Yes [provider]  atorvastatin (LIPITOR) 40 MG tablet Take 1 tablet (40 mg  total) by mouth at bedtime. 08/17/21  Yes [name], FNP  ENTRESTO 24-26 MG Take 1 tablet by mouth twice daily 10/18/21  Yes [name], FNP  furosemide (LASIX) 40 MG tablet Take 0.5 tablets (20 mg total) by mouth daily. And additional 1/2 tablet if needed for weight gain or swelling 09/14/21  Yes Nevin Kozuch A, FNP  isosorbide-hydrALAZINE (BIDIL) 20-37.5 MG tablet Take 1 tablet by mouth 3 (three) times daily. 08/03/21  Yes [name], FNP  metoprolol succinate (TOPROL-XL) 50 MG 24 hr tablet Take 1 tablet (50 mg total) by mouth daily. Take with or immediately following a meal. 08/17/21  Yes [name], FNP  dapagliflozin propanediol (FARXIGA) 10 MG TABS tablet Take 1 tablet (10 mg total) by mouth daily before breakfast. Patient not taking: Reported on 01/08/2022 07/03/21   [name], FNP  potassium chloride SA (KLOR-CON M) 20 MEQ tablet Take 2 tablets (40 mEq total) by mouth 2 (two) times daily. Patient not taking: Reported on 01/08/2022 05/29/21   [name], FNP   Review of Systems  Constitutional:  Negative for appetite change and fatigue.  HENT:  Negative for congestion, postnasal drip and sore throat.   Eyes: Negative.   Respiratory:  Negative for cough, chest tightness and shortness of breath.  Cardiovascular:  Negative for chest pain, palpitations and leg swelling.  Gastrointestinal:  Negative for abdominal distention and abdominal pain.  Endocrine: Negative.   Genitourinary: Negative.   Musculoskeletal:  Negative for back pain and neck pain.  Skin: Negative.   Allergic/Immunologic: Negative.   Neurological:  Negative for dizziness and light-headedness.  Hematological:  Negative for adenopathy. Does not bruise/bleed easily.  Psychiatric/Behavioral:  Negative for dysphoric mood and sleep disturbance (sleeping on 2 pillows). The patient is not nervous/anxious.    Vitals:   01/08/22 1359  BP: (!) 140/99  Pulse: (!) 55  Resp: 20  SpO2: 99%  Weight: (!) 341 lb  (154.7 kg)   Wt Readings from Last 3 Encounters:  01/08/22 (!) 341 lb (154.7 kg)  11/06/21 (!) 346 lb 8 oz (157.2 kg)  09/04/21 (!) 339 lb (153.8 kg)   Lab Results  Component Value Date   CREATININE 1.19 08/03/2021   CREATININE 1.36 (H) 07/03/2021   CREATININE 1.29 (H) 05/16/2021   Physical Exam Vitals and nursing note reviewed.  Constitutional:      Appearance: Normal appearance.  HENT:     Head: Normocephalic and atraumatic.  Cardiovascular:     Rate and Rhythm: Regular rhythm. Bradycardia present.     Heart sounds: Normal heart sounds.  Pulmonary:     Effort: Pulmonary effort is normal. No respiratory distress.     Breath sounds: No wheezing or rales.  Abdominal:     General: There is no distension.     Palpations: Abdomen is soft.     Tenderness: There is no abdominal tenderness.  Musculoskeletal:        General: No tenderness.     Cervical back: Normal range of motion and neck supple.     Right lower leg: No edema.     Left lower leg: No edema.  Skin:    General: Skin is warm and dry.  Neurological:     General: No focal deficit present.     Mental Status: John Lowe is alert and oriented to person, place, and time.  Psychiatric:        Mood and Affect: Mood normal.        Behavior: Behavior normal.        Thought Content: Thought content normal.   Assessment & Plan:  1: Chronic heart failure with mildly reduced ejection fraction- - NYHA class I - euvolemic today - weighing daily; reminded to call for an overnight weight gain of > 2 pounds or a weekly weight gain of > 5 pounds - weight down 5 pounds from last visit here 2 months ago - not adding salt and has been reading food labels for sodium content; reviewed the importance of keeping daily sodium to < 2000mg  / day - keeping his daily fluid intake to between 60-64 ounces - on GDMT of entresto, metoprolol & farxiga - consider entreto titration next visit - bradycardic so unable to titrate metoprolol - begin  spironolactone 25mg  daily; no more potassium supplements - check BMP in 1 week and then again at next visit - BNP 05/14/21 was 286.7  2: HTN- - BP mildly elevated (140/99; starting spironolactone per above - saw PCP ) 06/08/21 - BMP 08/03/21 reviewed and showed sodium 140, potassium 4.1, creatinine 1.19 and GFR >60   Medication bottles reviewed.   Return in 1 month, sooner if needed.

## 2022-01-08 ENCOUNTER — Other Ambulatory Visit: Payer: Self-pay | Admitting: Family

## 2022-01-08 ENCOUNTER — Encounter: Payer: Self-pay | Admitting: Family

## 2022-01-08 ENCOUNTER — Ambulatory Visit: Payer: Self-pay | Attending: Family | Admitting: Family

## 2022-01-08 VITALS — BP 140/99 | HR 55 | Resp 20 | Wt 341.0 lb

## 2022-01-08 DIAGNOSIS — I110 Hypertensive heart disease with heart failure: Secondary | ICD-10-CM | POA: Insufficient documentation

## 2022-01-08 DIAGNOSIS — I5022 Chronic systolic (congestive) heart failure: Secondary | ICD-10-CM

## 2022-01-08 DIAGNOSIS — Z7984 Long term (current) use of oral hypoglycemic drugs: Secondary | ICD-10-CM | POA: Insufficient documentation

## 2022-01-08 DIAGNOSIS — Z79899 Other long term (current) drug therapy: Secondary | ICD-10-CM | POA: Insufficient documentation

## 2022-01-08 DIAGNOSIS — I10 Essential (primary) hypertension: Secondary | ICD-10-CM

## 2022-01-08 DIAGNOSIS — M7989 Other specified soft tissue disorders: Secondary | ICD-10-CM | POA: Insufficient documentation

## 2022-01-08 DIAGNOSIS — Z6841 Body Mass Index (BMI) 40.0 and over, adult: Secondary | ICD-10-CM | POA: Insufficient documentation

## 2022-01-08 DIAGNOSIS — I509 Heart failure, unspecified: Secondary | ICD-10-CM | POA: Insufficient documentation

## 2022-01-08 DIAGNOSIS — E785 Hyperlipidemia, unspecified: Secondary | ICD-10-CM | POA: Insufficient documentation

## 2022-01-08 MED ORDER — SPIRONOLACTONE 25 MG PO TABS: 25.0000 mg | ORAL_TABLET | Freq: Every day | ORAL | 3 refills | Status: DC

## 2022-01-08 NOTE — Patient Instructions (Addendum)
Continue weighing daily and call for an overnight weight gain of 3 pounds or more or a weekly weight gain of more than 5 pounds.   If you have voicemail, please make sure your mailbox is cleaned out so that we may leave a message and please make sure to listen to any voicemails.    Begin spironolactone as 1 tablet every day.     You will go to the lab on the 1st floor of [facility] next Tuesday at 2pm to get your labs done.

## 2022-01-16 ENCOUNTER — Inpatient Hospital Stay: Admission: RE | Admit: 2022-01-16 | Payer: Self-pay | Source: Ambulatory Visit

## 2022-01-22 ENCOUNTER — Encounter
Admission: RE | Admit: 2022-01-22 | Discharge: 2022-01-22 | Disposition: A | Discharge status: Home / Self Care | Payer: Self-pay | Source: Ambulatory Visit | Attending: Family | Admitting: Family

## 2022-01-22 ENCOUNTER — Other Ambulatory Visit
Admission: RE | Admit: 2022-01-22 | Discharge: 2022-01-22 | Disposition: A | Discharge status: Home / Self Care | Payer: Self-pay | Source: Ambulatory Visit | Attending: Family | Admitting: Family

## 2022-01-22 DIAGNOSIS — Z01818 Encounter for other preprocedural examination: Secondary | ICD-10-CM | POA: Insufficient documentation

## 2022-01-22 DIAGNOSIS — I509 Heart failure, unspecified: Secondary | ICD-10-CM | POA: Insufficient documentation

## 2022-01-22 DIAGNOSIS — I5022 Chronic systolic (congestive) heart failure: Secondary | ICD-10-CM

## 2022-01-22 LAB — BASIC METABOLIC PANEL
Anion gap: 6 (ref 5–15)
BUN: 17 mg/dL (ref 6–20)
CO2: 30 mmol/L (ref 22–32)
Calcium: 9.2 mg/dL (ref 8.9–10.3)
Chloride: 107 mmol/L (ref 98–111)
Creatinine, Ser: 1.14 mg/dL (ref 0.61–1.24)
GFR, Estimated: >60 mL/min (ref >60)
Glucose, Bld: 97 mg/dL (ref 70–99)
Potassium: 3.8 mmol/L (ref 3.5–5.1)
Sodium: 143 mmol/L (ref 135–145)

## 2022-01-26 ENCOUNTER — Other Ambulatory Visit: Payer: Self-pay | Admitting: Family

## 2022-02-08 ENCOUNTER — Encounter: Payer: Self-pay | Admitting: Family

## 2022-02-08 ENCOUNTER — Ambulatory Visit: Payer: Self-pay | Attending: Family | Admitting: Family

## 2022-02-08 VITALS — BP 157/109 | HR 61 | Resp 20 | Wt 342.0 lb

## 2022-02-08 DIAGNOSIS — Z79899 Other long term (current) drug therapy: Secondary | ICD-10-CM | POA: Insufficient documentation

## 2022-02-08 DIAGNOSIS — M7989 Other specified soft tissue disorders: Secondary | ICD-10-CM | POA: Insufficient documentation

## 2022-02-08 DIAGNOSIS — Z6841 Body Mass Index (BMI) 40.0 and over, adult: Secondary | ICD-10-CM | POA: Insufficient documentation

## 2022-02-08 DIAGNOSIS — I5022 Chronic systolic (congestive) heart failure: Secondary | ICD-10-CM | POA: Insufficient documentation

## 2022-02-08 DIAGNOSIS — I110 Hypertensive heart disease with heart failure: Secondary | ICD-10-CM | POA: Insufficient documentation

## 2022-02-08 DIAGNOSIS — I10 Essential (primary) hypertension: Secondary | ICD-10-CM

## 2022-02-08 DIAGNOSIS — E785 Hyperlipidemia, unspecified: Secondary | ICD-10-CM | POA: Insufficient documentation

## 2022-02-08 DIAGNOSIS — Z7984 Long term (current) use of oral hypoglycemic drugs: Secondary | ICD-10-CM | POA: Insufficient documentation

## 2022-02-08 LAB — BASIC METABOLIC PANEL
Anion gap: 2 — ABNORMAL LOW (ref 5–15)
BUN: 15 mg/dL (ref 6–20)
CO2: 27 mmol/L (ref 22–32)
Calcium: 8.9 mg/dL (ref 8.9–10.3)
Chloride: 108 mmol/L (ref 98–111)
Creatinine, Ser: 1.23 mg/dL (ref 0.61–1.24)
GFR, Estimated: >60 mL/min (ref >60)
Glucose, Bld: 110 mg/dL — ABNORMAL HIGH (ref 70–99)
Potassium: 3.3 mmol/L — ABNORMAL LOW (ref 3.5–5.1)
Sodium: 137 mmol/L (ref 135–145)

## 2022-02-08 MED ORDER — METOPROLOL SUCCINATE ER 100 MG PO TB24: 100.0000 mg | ORAL_TABLET | Freq: Every day | ORAL | 3 refills | Status: DC

## 2022-02-08 NOTE — Progress Notes (Signed)
Patient ID: John Lowe, male    DOB: Jan 02, 1981, 41 y.o.   MRN: [id number]  HPI  Mr Chasteen is a 41 y/o male with a history of hyperlipidemia, HTN, obesity and chronic heart failure.   Echo report from 05/15/21 reviewed and showed an EF of 45-50% along with moderate LVH and severe LAE.   Has not been admitted or been in the ED in the last 6 months.   He presents today with a chief complaint of a follow-up visit. He currently has no symptoms and specifically denies any difficulty sleeping, dizziness, abdominal distention, palpitations, pedal edema, chest pain, shortness of breath, cough, fatigue or weight gain.   Took his medications ~ 2 hours before today's appointment and normally he takes them much earlier in the day.   Checks his BP at home and says the top number runs 130-135.  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 Does notice some swelling in his legs at the end of this shift.   Past Medical History:  Diagnosis Date   Arthritis    CHF (congestive heart failure) (HCC)    Hyperlipidemia    Hypertension    Obesity, Class III, BMI 40-49.9 (morbid obesity) (HCC)    Past Surgical History:  Procedure Laterality Date   ANKLE SURGERY     rotator cuff Right    Family History  Problem Relation Age of Onset   Hypertension Mother    Hypertension Father    Heart disease Father    Kidney disease Father    Hypertension Sister    Hypertension Brother    Cancer Maternal Grandmother        breast   Cancer Maternal Grandfather        bone cancer   Hypertension Paternal Grandmother    Social History   Tobacco Use   Smoking status: Never   Smokeless tobacco: Never  Substance Use Topics   Alcohol use: No   No Known Allergies  Prior to Admission medications   Medication Sig Start Date End Date Taking? Authorizing Provider  aspirin 81 MG tablet Take 81 mg by mouth daily.   Yes [provider]  atorvastatin (LIPITOR) 40 MG tablet Take 1  tablet (40 mg total) by mouth at bedtime. 08/17/21  Yes [name], FNP  dapagliflozin propanediol (FARXIGA) 10 MG TABS tablet Take 1 tablet (10 mg total) by mouth daily before breakfast. 07/03/21  Yes [name], FNP  ENTRESTO 24-26 MG Take 1 tablet by mouth twice daily 10/18/21  Yes [name], FNP  furosemide (LASIX) 40 MG tablet Take 0.5 tablets (20 mg total) by mouth daily. And additional 1/2 tablet if needed for weight gain or swelling 09/14/21  Yes Jem Castro A, FNP  isosorbide-hydrALAZINE (BIDIL) 20-37.5 MG tablet Take 1 tablet by mouth 3 (three) times daily. 01/27/22  Yes [name], FNP  metoprolol succinate (TOPROL-XL) 50 MG 24 hr tablet Take 1 tablet (50 mg total) by mouth daily. Take with or immediately following a meal. 08/17/21  Yes [name], FNP  spironolactone (ALDACTONE) 25 MG tablet Take 1 tablet (25 mg total) by mouth daily. 01/08/22  Yes [name], FNP   Review of Systems  Constitutional:  Negative for appetite change and fatigue.  HENT:  Negative for congestion, postnasal drip and sore throat.   Eyes: Negative.   Respiratory:  Negative for cough, chest tightness and shortness of breath.   Cardiovascular:  Negative  for chest pain, palpitations and leg swelling.  Gastrointestinal:  Negative for abdominal distention and abdominal pain.  Endocrine: Negative.   Genitourinary: Negative.   Musculoskeletal:  Negative for back pain and neck pain.  Skin: Negative.   Allergic/Immunologic: Negative.   Neurological:  Negative for dizziness and light-headedness.  Hematological:  Negative for adenopathy. Does not bruise/bleed easily.  Psychiatric/Behavioral:  Negative for dysphoric mood and sleep disturbance (sleeping on 2 pillows). The patient is not nervous/anxious.    Vitals:   02/08/22 1421  BP: (!) 157/109  Pulse: 61  Resp: 20  SpO2: 97%  Weight: (!) 342 lb (155.1 kg)   Wt Readings from Last 3 Encounters:  02/08/22 (!) 342 lb (155.1 kg)   01/08/22 (!) 341 lb (154.7 kg)  11/06/21 (!) 346 lb 8 oz (157.2 kg)   Lab Results  Component Value Date   CREATININE 1.23 02/08/2022   CREATININE 1.14 01/22/2022   CREATININE 1.19 08/03/2021   Physical Exam Vitals and nursing note reviewed.  Constitutional:      Appearance: Normal appearance.  HENT:     Head: Normocephalic and atraumatic.  Cardiovascular:     Rate and Rhythm: Normal rate and regular rhythm.     Heart sounds: Normal heart sounds.  Pulmonary:     Effort: Pulmonary effort is normal. No respiratory distress.     Breath sounds: No wheezing or rales.  Abdominal:     General: There is no distension.     Palpations: Abdomen is soft.     Tenderness: There is no abdominal tenderness.  Musculoskeletal:        General: No tenderness.     Cervical back: Normal range of motion and neck supple.     Right lower leg: No edema.     Left lower leg: No edema.  Skin:    General: Skin is warm and dry.  Neurological:     General: No focal deficit present.     Mental Status: He is alert and oriented to person, place, and time.  Psychiatric:        Mood and Affect: Mood normal.        Behavior: Behavior normal.        Thought Content: Thought content normal.   Assessment & Plan:  1: Chronic heart failure with mildly reduced ejection fraction- - NYHA class I - euvolemic today - weighing daily; reminded to call for an overnight weight gain of > 2 pounds or a weekly weight gain of > 5 pounds - weight up 1 pound from last visit here 1 month ago - not adding salt and has been reading food labels for sodium content; reviewed the importance of keeping daily sodium to < 2000mg  / day - keeping his daily fluid intake to between 60-64 ounces - on GDMT of entresto, metoprolol, spironolactone & farxiga - will increase his metoprolol to 100mg  daily; he can finish his current dose by taking 2 tablets daily until gone and then when he picks up the new RX, he will resume taking 1 tablet  daily.  - check BMP today due to starting spironolactone at last visit - BNP 05/14/21 was 286.7  2: HTN- - BP elevated (157/109) and was still elevated upon recheck with manual cuff - says that he took his meds ~ 2 hours ago and normally he takes them much earlier in the day; checks his BP at home and says that his top number stays 130-135 - increasing metoprolol per above and possible titrate  entresto next visit - saw PCP Suzie Portela) 06/08/21 - BMP 01/22/22 reviewed and showed sodium 143, potassium 3.8, creatinine 1.14 and GFR >60   Medication bottles reviewed.   Return in 3-4 weeks, sooner if needed.

## 2022-02-08 NOTE — Patient Instructions (Addendum)
Continue weighing daily and call for an overnight weight gain of 3 pounds or more or a weekly weight gain of more than 5 pounds.   If you have voicemail, please make sure your mailbox is cleaned out so that we may leave a message and please make sure to listen to any voicemails.    Finish current metoprolol by taking 2 tablets daily until gone. When you pick up the new bottle, it will be 100mg  tablet and you will resume taking 1 tablet daily.

## 2022-02-19 ENCOUNTER — Other Ambulatory Visit: Payer: Self-pay | Admitting: Family

## 2022-03-06 ENCOUNTER — Other Ambulatory Visit: Payer: Self-pay | Admitting: Family Medicine

## 2022-03-06 NOTE — Progress Notes (Unsigned)
Patient ID: John Lowe, male    DOB: [birth year], 42 y.o.   MRN: [id number]  HPI  John Lowe is Lowe 42 y/o male with Lowe history of hyperlipidemia, HTN, obesity and chronic heart failure.   Echo report from 05/15/21 reviewed and showed an EF of 45-50% along with moderate LVH and severe LAE.   Has not been admitted or been in the ED in the last 6 months.   He presents today with Lowe chief complaint of Lowe follow-up visit. Has no complaints and specifically denies any difficulty sleeping, dizziness, abdominal distention, palpitations, pedal edema, chest pain, shortness of breath, cough, fatigue or weight gain.   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 Does notice some swelling in his legs at the end of this shift.   Past Medical History:  Diagnosis Date   Arthritis    CHF (congestive heart failure) (HCC)    Hyperlipidemia    Hypertension    Obesity, Class III, BMI 40-49.9 (morbid obesity) (HCC)    Past Surgical History:  Procedure Laterality Date   ANKLE SURGERY     rotator cuff Right    Family History  Problem Relation Age of Onset   Hypertension Mother    Hypertension Father    Heart disease Father    Kidney disease Father    Hypertension Sister    Hypertension Brother    Cancer Maternal Grandmother        breast   Cancer Maternal Grandfather        bone cancer   Hypertension Paternal Grandmother    Social History   Tobacco Use   Smoking status: Never   Smokeless tobacco: Never  Substance Use Topics   Alcohol use: No   No Known Allergies  Prior to Admission medications   Medication Sig Start Date End Date Taking? Authorizing Provider  aspirin 81 MG tablet Take 81 mg by mouth daily.   Yes [provider]  atorvastatin (LIPITOR) 40 MG tablet TAKE 1 TABLET BY MOUTH AT BEDTIME 03/07/22  Yes John Kindle, FNP  dapagliflozin propanediol (FARXIGA) 10 MG TABS tablet Take 1 tablet (10 mg total) by mouth daily before breakfast. 02/19/22   Yes John Freeze, FNP  ENTRESTO 24-26 MG Take 1 tablet by mouth twice daily 10/18/21  Yes John Kindred A, FNP  furosemide (LASIX) 40 MG tablet Take 0.5 tablets (20 mg total) by mouth daily. And additional 1/2 tablet if needed for weight gain or swelling 09/14/21  Yes John Lowe A, FNP  isosorbide-hydrALAZINE (BIDIL) 20-37.5 MG tablet Take 1 tablet by mouth 3 (three) times daily. 01/27/22  Yes John Kindred A, FNP  metoprolol succinate (TOPROL-XL) 100 MG 24 hr tablet Take 1 tablet (100 mg total) by mouth daily. Take with or immediately following Lowe meal. 02/08/22  Yes John Reddick A, FNP  spironolactone (ALDACTONE) 25 MG tablet Take 1 tablet (25 mg total) by mouth daily. 01/08/22  Yes John Freeze, FNP    Review of Systems  Constitutional:  Negative for appetite change and fatigue.  HENT:  Negative for congestion, postnasal drip and sore throat.   Eyes: Negative.   Respiratory:  Negative for cough, chest tightness and shortness of breath.   Cardiovascular:  Negative for chest pain, palpitations and leg swelling.  Gastrointestinal:  Negative for abdominal distention and abdominal pain.  Endocrine: Negative.   Genitourinary: Negative.   Musculoskeletal:  Negative for back pain and neck pain.  Skin: Negative.  Allergic/Immunologic: Negative.   Neurological:  Negative for dizziness and light-headedness.  Hematological:  Negative for adenopathy. Does not bruise/bleed easily.  Psychiatric/Behavioral:  Negative for dysphoric mood and sleep disturbance (sleeping on 2 pillows). The patient is not nervous/anxious.    Vitals:   03/07/22 1415 03/07/22 1430  BP: (!) 146/99 (!) 138/90  Pulse: (!) 56   Resp: 20   SpO2: 100%   Weight: (!) 344 lb (156 kg)    Wt Readings from Last 3 Encounters:  03/07/22 (!) 344 lb (156 kg)  02/08/22 (!) 342 lb (155.1 kg)  01/08/22 (!) 341 lb (154.7 kg)   Lab Results  Component Value Date   CREATININE 1.23 02/08/2022   CREATININE 1.14 01/22/2022    CREATININE 1.19 08/03/2021   Physical Exam Vitals and nursing note reviewed.  Constitutional:      Appearance: Normal appearance.  HENT:     Head: Normocephalic and atraumatic.  Cardiovascular:     Rate and Rhythm: Regular rhythm. Bradycardia present.     Heart sounds: Normal heart sounds.  Pulmonary:     Effort: Pulmonary effort is normal. No respiratory distress.     Breath sounds: No wheezing or rales.  Abdominal:     General: There is no distension.     Palpations: Abdomen is soft.     Tenderness: There is no abdominal tenderness.  Musculoskeletal:        General: No tenderness.     Cervical back: Normal range of motion and neck supple.     Right lower leg: No edema.     Left lower leg: No edema.  Skin:    General: Skin is warm and dry.  Neurological:     General: No focal deficit present.     Mental Status: He is alert and oriented to person, place, and time.  Psychiatric:        Mood and Affect: Mood normal.        Behavior: Behavior normal.        Thought Content: Thought content normal.   Assessment & Plan:  1: Chronic heart failure with mildly reduced ejection fraction- - NYHA class I - euvolemic today - weighing daily; reminded to call for an overnight weight gain of > 2 pounds or Lowe weekly weight gain of > 5 pounds - weight up 2 pounds from last visit here 3 weeks ago - not adding salt and has been reading food labels for sodium content; reviewed the importance of keeping daily sodium to < 2000mg  / day - keeping his daily fluid intake to between 60-64 ounces - on GDMT of entresto, metoprolol, spironolactone & farxiga - increase entresto to 49/51mg  BID; use up current bottle by taking 2 tabs BID until gone and then when picks up the new bottle, resume taking 1 tab BID - check BMP next visit - update echo sometime this year - BNP 05/14/21 was 286.7  2: HTN- - BP 138/90; increasing entresto per above - saw PCP John Lowe) 06/08/21 - BMP 02/08/22 reviewed and showed  sodium 137, potassium 3.3, creatinine 1.23 and GFR >60 - BMP today   Medication list reviewed.   Return in 3 weeks, sooner if needed.

## 2022-03-07 ENCOUNTER — Encounter: Payer: Self-pay | Admitting: Family

## 2022-03-07 ENCOUNTER — Ambulatory Visit (HOSPITAL_BASED_OUTPATIENT_CLINIC_OR_DEPARTMENT_OTHER): Payer: Self-pay | Admitting: Family

## 2022-03-07 ENCOUNTER — Other Ambulatory Visit
Admission: RE | Admit: 2022-03-07 | Discharge: 2022-03-07 | Disposition: A | Discharge status: Home / Self Care | Payer: Self-pay | Source: Ambulatory Visit | Attending: Family | Admitting: Family

## 2022-03-07 VITALS — BP 138/90 | HR 56 | Resp 20 | Wt 344.0 lb

## 2022-03-07 DIAGNOSIS — I5022 Chronic systolic (congestive) heart failure: Secondary | ICD-10-CM

## 2022-03-07 DIAGNOSIS — I10 Essential (primary) hypertension: Secondary | ICD-10-CM

## 2022-03-07 LAB — BASIC METABOLIC PANEL
Anion gap: 10 (ref 5–15)
BUN: 17 mg/dL (ref 6–20)
CO2: 27 mmol/L (ref 22–32)
Calcium: 9.3 mg/dL (ref 8.9–10.3)
Chloride: 101 mmol/L (ref 98–111)
Creatinine, Ser: 1.25 mg/dL — ABNORMAL HIGH (ref 0.61–1.24)
GFR, Estimated: >60 mL/min (ref >60)
Glucose, Bld: 88 mg/dL (ref 70–99)
Potassium: 3.8 mmol/L (ref 3.5–5.1)
Sodium: 138 mmol/L (ref 135–145)

## 2022-03-07 MED ORDER — ENTRESTO 49-51 MG PO TABS: 1.0000 | ORAL_TABLET | Freq: Two times a day (BID) | ORAL | 5 refills | Status: DC

## 2022-03-07 NOTE — Telephone Encounter (Signed)
Requested Prescriptions  Pending Prescriptions Disp Refills   atorvastatin (LIPITOR) 40 MG tablet [Pharmacy Med Name: Atorvastatin Calcium 40 MG Oral Tablet] 90 tablet 0    Sig: TAKE 1 TABLET BY MOUTH AT BEDTIME     Cardiovascular:  Antilipid - Statins Failed - 03/06/2022  7:02 PM      Failed - Lipid Panel in normal range within the last 12 months    Cholesterol, Total  Date Value Ref Range Status  09/23/2014 208 (H) 100 - 199 mg/dL Final   Cholesterol  Date Value Ref Range Status  05/16/2021 209 (H) 0 - 200 mg/dL Final   LDL Cholesterol (Calc)  Date Value Ref Range Status  03/10/2018 176 (H) mg/dL (calc) Final    Comment:    Reference range: <100 . Desirable range <100 mg/dL for primary prevention;   <70 mg/dL for patients with CHD or diabetic patients  with > or = 2 CHD risk factors. Marland Kitchen LDL-C is now calculated using the Martin-Hopkins  calculation, which is a validated novel method providing  better accuracy than the Friedewald equation in the  estimation of LDL-C.  Cresenciano Genre et al. Annamaria Helling. 3762;831(51): 2061-2068  (http://education.[facility].com/faq/FAQ164)    LDL Cholesterol  Date Value Ref Range Status  05/16/2021 151 (H) 0 - 99 mg/dL Final    Comment:           Total Cholesterol/HDL:CHD Risk Coronary Heart Disease Risk Table                     Men   Women  1/2 Average Risk   3.4   3.3  Average Risk       5.0   4.4  2 X Average Risk   9.6   7.1  3 X Average Risk  23.4   11.0        Use the calculated Patient Ratio above and the CHD Risk Table to determine the patient's CHD Risk.        ATP III CLASSIFICATION (LDL):  <100     mg/dL   Optimal  100-129  mg/dL   Near or Above                    Optimal  130-159  mg/dL   Borderline  160-189  mg/dL   High  >190     mg/dL   Very High Performed at [facility], [place], [place] [address]    HDL  Date Value Ref Range Status  05/16/2021 41 >40 mg/dL Final  09/23/2014 46 >39 mg/dL  Final    Comment:    According to ATP-III Guidelines, HDL-C >59 mg/dL is considered a negative risk factor for CHD.    Triglycerides  Date Value Ref Range Status  05/16/2021 83 <150 mg/dL Final         Passed - Patient is not pregnant      Passed - Valid encounter within last 12 months    Recent Outpatient Visits           9 months ago Hospital discharge follow-up   [facility] Gwyneth Sprout, FNP   3 years ago Essential hypertension   [facility], Satira Anis, MD   4 years ago Essential hypertension   [facility], Satira Anis, MD   5 years ago Mixed hyperlipidemia   [place], Satira Anis, MD   6 years ago Pharyngitis,  unspecified etiology   [facility], Satira Anis, MD

## 2022-03-07 NOTE — Patient Instructions (Addendum)
Continue weighing daily and call for an overnight weight gain of 3 pounds or more or a weekly weight gain of more than 5 pounds.   If you have voicemail, please make sure your mailbox is cleaned out so that we may leave a message and please make sure to listen to any voicemails.    Finish your current entresto bottle by taking 2 tablets in the morning and 2 tablets in the evening. When you finish this bottle and pick up the new entresto dose (49/51) you will resume taking 1 tablet twice daily

## 2022-03-27 NOTE — Progress Notes (Unsigned)
Patient ID: John Lowe, male    DOB: 12-Nov-1980, 42 y.o.   MRN: [id number]  HPI  Mr Vos is a 42 y/o male with a history of hyperlipidemia, HTN, obesity and chronic heart failure.   Echo report from 05/15/21 reviewed and showed an EF of 45-50% along with moderate LVH and severe LAE.   Has not been admitted or been in the ED in the last 6 months.   He presents today with a chief complaint of a follow-up visit. Currently voices no complaints and specifically denies any difficulty sleeping, dizziness, abdominal distention, palpitations, pedal edema, chest pain, shortness of breath, cough, fatigue or weight gain.    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 Does notice some swelling in his legs at the end of this shift.   Past Medical History:  Diagnosis Date   Arthritis    CHF (congestive heart failure) (HCC)    Hyperlipidemia    Hypertension    Obesity, Class III, BMI 40-49.9 (morbid obesity) ([place])    Past Surgical History:  Procedure Laterality Date   ANKLE SURGERY     rotator cuff Right    Family History  Problem Relation Age of Onset   Hypertension Mother    Hypertension Father    Heart disease Father    Kidney disease Father    Hypertension Sister    Hypertension Brother    Cancer Maternal Grandmother        breast   Cancer Maternal Grandfather        bone cancer   Hypertension Paternal Grandmother    Social History   Tobacco Use   Smoking status: Never   Smokeless tobacco: Never  Substance Use Topics   Alcohol use: No   No Known Allergies  Prior to Admission medications   Medication Sig Start Date End Date Taking? Authorizing Provider  aspirin 81 MG tablet Take 81 mg by mouth daily.   Yes [provider]  atorvastatin (LIPITOR) 40 MG tablet TAKE 1 TABLET BY MOUTH AT BEDTIME 03/07/22  Yes Gwyneth Sprout, FNP  dapagliflozin propanediol (FARXIGA) 10 MG TABS tablet Take 1 tablet (10 mg total) by mouth daily before  breakfast. 02/19/22  Yes Darylene Price A, FNP  furosemide (LASIX) 40 MG tablet Take 0.5 tablets (20 mg total) by mouth daily. And additional 1/2 tablet if needed for weight gain or swelling 09/14/21  Yes Onesti Bonfiglio A, FNP  isosorbide-hydrALAZINE (BIDIL) 20-37.5 MG tablet Take 1 tablet by mouth 3 (three) times daily. 01/27/22  Yes Darylene Price A, FNP  metoprolol succinate (TOPROL-XL) 100 MG 24 hr tablet Take 1 tablet (100 mg total) by mouth daily. Take with or immediately following a meal. 02/08/22  Yes Nolyn Eilert A, FNP  sacubitril-valsartan (ENTRESTO) 49-51 MG Take 1 tablet by mouth 2 (two) times daily. 03/07/22  Yes Darylene Price A, FNP  spironolactone (ALDACTONE) 25 MG tablet Take 1 tablet (25 mg total) by mouth daily. 01/08/22  Yes Alisa Graff, FNP   Review of Systems  Constitutional:  Negative for appetite change and fatigue.  HENT:  Negative for congestion, postnasal drip and sore throat.   Eyes: Negative.   Respiratory:  Negative for cough, chest tightness and shortness of breath.   Cardiovascular:  Negative for chest pain, palpitations and leg swelling.  Gastrointestinal:  Negative for abdominal distention and abdominal pain.  Endocrine: Negative.   Genitourinary: Negative.   Musculoskeletal:  Negative for back pain and neck  pain.  Skin: Negative.   Allergic/Immunologic: Negative.   Neurological:  Negative for dizziness and light-headedness.  Hematological:  Negative for adenopathy. Does not bruise/bleed easily.  Psychiatric/Behavioral:  Negative for dysphoric mood and sleep disturbance (sleeping on 2 pillows). The patient is not nervous/anxious.    Vitals:   03/28/22 1422  BP: 122/76  Pulse: 67  Resp: 20  SpO2: 98%  Weight: (!) 345 lb 2 oz (156.5 kg)   Wt Readings from Last 3 Encounters:  03/28/22 (!) 345 lb 2 oz (156.5 kg)  03/07/22 (!) 344 lb (156 kg)  02/08/22 (!) 342 lb (155.1 kg)   Lab Results  Component Value Date   CREATININE 1.25 (H) 03/07/2022    CREATININE 1.23 02/08/2022   CREATININE 1.14 01/22/2022   Physical Exam Vitals and nursing note reviewed.  Constitutional:      Appearance: Normal appearance.  HENT:     Head: Normocephalic and atraumatic.  Cardiovascular:     Rate and Rhythm: Normal rate and regular rhythm.  Pulmonary:     Effort: Pulmonary effort is normal. No respiratory distress.     Breath sounds: No wheezing or rales.  Abdominal:     General: There is no distension.     Palpations: Abdomen is soft.     Tenderness: There is no abdominal tenderness.  Musculoskeletal:        General: No tenderness.     Cervical back: Normal range of motion and neck supple.     Right lower leg: No edema.     Left lower leg: No edema.  Skin:    General: Skin is warm and dry.  Neurological:     General: No focal deficit present.     Mental Status: He is alert and oriented to person, place, and time.  Psychiatric:        Mood and Affect: Mood normal.        Behavior: Behavior normal.        Thought Content: Thought content normal.   Assessment & Plan:  1: Chronic heart failure with mildly reduced ejection fraction- - NYHA class I - euvolemic today - weighing daily; reminded to call for an overnight weight gain of > 2 pounds or a weekly weight gain of > 5 pounds - weight unchanged from last visit here 3 weeks ago - not adding salt and has been reading food labels for sodium content; reviewed the importance of keeping daily sodium to < 2000mg  / day - keeping his daily fluid intake to between 60-64 ounces - on GDMT of entresto, metoprolol, spironolactone & farxiga - check BMP today as entresto increased at last visit - get echo updated after next visit - BNP 05/14/21 was 286.7  2: HTN- - BP 122/76 - saw PCP Rollene Rotunda) 06/08/21 - BMP 03/07/22 reviewed and showed sodium 138, potassium 3.8, creatinine 1.25 and GFR >60   Medication bottles reviewed.  Return in 3 months, sooner if needed.

## 2022-03-28 ENCOUNTER — Other Ambulatory Visit
Admission: RE | Admit: 2022-03-28 | Discharge: 2022-03-28 | Disposition: A | Discharge status: Home / Self Care | Payer: Self-pay | Source: Ambulatory Visit | Attending: Family | Admitting: Family

## 2022-03-28 ENCOUNTER — Encounter: Payer: Self-pay | Admitting: Family

## 2022-03-28 ENCOUNTER — Ambulatory Visit: Payer: Self-pay | Attending: Family | Admitting: Family

## 2022-03-28 VITALS — BP 122/76 | HR 67 | Resp 20 | Wt 345.1 lb

## 2022-03-28 DIAGNOSIS — I5022 Chronic systolic (congestive) heart failure: Secondary | ICD-10-CM

## 2022-03-28 DIAGNOSIS — Z7984 Long term (current) use of oral hypoglycemic drugs: Secondary | ICD-10-CM | POA: Insufficient documentation

## 2022-03-28 DIAGNOSIS — I110 Hypertensive heart disease with heart failure: Secondary | ICD-10-CM | POA: Insufficient documentation

## 2022-03-28 DIAGNOSIS — E785 Hyperlipidemia, unspecified: Secondary | ICD-10-CM | POA: Insufficient documentation

## 2022-03-28 DIAGNOSIS — M7989 Other specified soft tissue disorders: Secondary | ICD-10-CM | POA: Insufficient documentation

## 2022-03-28 DIAGNOSIS — I10 Essential (primary) hypertension: Secondary | ICD-10-CM

## 2022-03-28 DIAGNOSIS — Z6841 Body Mass Index (BMI) 40.0 and over, adult: Secondary | ICD-10-CM | POA: Insufficient documentation

## 2022-03-28 DIAGNOSIS — Z79899 Other long term (current) drug therapy: Secondary | ICD-10-CM | POA: Insufficient documentation

## 2022-03-28 LAB — BASIC METABOLIC PANEL
Anion gap: 7 (ref 5–15)
BUN: 17 mg/dL (ref 6–20)
CO2: 28 mmol/L (ref 22–32)
Calcium: 9 mg/dL (ref 8.9–10.3)
Chloride: 103 mmol/L (ref 98–111)
Creatinine, Ser: 1.12 mg/dL (ref 0.61–1.24)
GFR, Estimated: >60 mL/min (ref >60)
Glucose, Bld: 93 mg/dL (ref 70–99)
Potassium: 3.8 mmol/L (ref 3.5–5.1)
Sodium: 138 mmol/L (ref 135–145)

## 2022-03-28 NOTE — Patient Instructions (Signed)
Continue weighing daily and call for an overnight weight gain of 3 pounds or more or a weekly weight gain of more than 5 pounds.   If you have voicemail, please make sure your mailbox is cleaned out so that we may leave a message and please make sure to listen to any voicemails.     

## 2022-06-08 ENCOUNTER — Other Ambulatory Visit: Payer: Self-pay | Admitting: Family Medicine

## 2022-06-11 NOTE — Telephone Encounter (Signed)
Requested medication (s) are due for refill today: Yes  Requested medication (s) are on the active medication list: Yes  Last refill:  03/07/22  Future visit scheduled: No  Notes to clinic:  Needs appointment. Called and left message to schedule appointment.    Requested Prescriptions  Pending Prescriptions Disp Refills   atorvastatin (LIPITOR) 40 MG tablet [Pharmacy Med Name: Atorvastatin Calcium 40 MG Oral Tablet] 90 tablet 0    Sig: TAKE 1 TABLET BY MOUTH AT BEDTIME     Cardiovascular:  Antilipid - Statins Failed - 06/08/2022  8:46 PM      Failed - Valid encounter within last 12 months    Recent Outpatient Visits           1 year ago Hospital discharge follow-up   [facility] [name], FNP   4 years ago Essential hypertension   [place] [facility] Lada, Janit Bern, MD   5 years ago Essential hypertension   [place] [facility] Lada, Janit Bern, MD   5 years ago Mixed hyperlipidemia   [facility] L[facility], Janit Bern, MD   6 years ago Pharyngitis, unspecified etiology   [place] [facility], Janit Bern, MD              Failed - Lipid Panel in normal range within the last 12 months    Cholesterol, Total  Date Value Ref Range Status  09/23/2014 208 (H) 100 - 199 mg/dL Final   Cholesterol  Date Value Ref Range Status  05/16/2021 209 (H) 0 - 200 mg/dL Final   LDL Cholesterol (Calc)  Date Value Ref Range Status  03/10/2018 176 (H) mg/dL (calc) Final    Comment:    Reference range: <100 . Desirable range <100 mg/dL for primary prevention;   <70 mg/dL for patients with CHD or diabetic patients  with > or = 2 CHD risk factors. Marland Kitchen LDL-C is now calculated using the Martin-Hopkins  calculation, which is a validated novel method providing  better accuracy than the Friedewald equation in the  estimation of LDL-C.  Horald Pollen et al. Lenox Ahr. 8657;846(96):  2061-2068  (http://education.[facility].com/faq/FAQ164)    LDL Cholesterol  Date Value Ref Range Status  05/16/2021 151 (H) 0 - 99 mg/dL Final    Comment:           Total Cholesterol/HDL:CHD Risk Coronary Heart Disease Risk Table                     Men   Women  1/2 Average Risk   3.4   3.3  Average Risk       5.0   4.4  2 X Average Risk   9.6   7.1  3 X Average Risk  23.4   11.0        Use the calculated Patient Ratio above and the CHD Risk Table to determine the patient's CHD Risk.        ATP III CLASSIFICATION (LDL):  <100     mg/dL   Optimal  295-284  mg/dL   Near or Above                    Optimal  130-159  mg/dL   Borderline  132-440  mg/dL   High  >102     mg/dL   Very High Performed at [facility], [address], [place], [place] [address]  HDL  Date Value Ref Range Status  05/16/2021 41 >40 mg/dL Final  16/11/9602 46 >54 mg/dL Final    Comment:    According to ATP-III Guidelines, HDL-C >59 mg/dL is considered a negative risk factor for CHD.    Triglycerides  Date Value Ref Range Status  05/16/2021 83 <150 mg/dL Final         Passed - Patient is not pregnant

## 2022-06-21 ENCOUNTER — Other Ambulatory Visit: Payer: Self-pay | Admitting: Family

## 2022-06-26 ENCOUNTER — Encounter: Payer: Self-pay | Admitting: Family

## 2022-06-26 ENCOUNTER — Ambulatory Visit: Payer: No Typology Code available for payment source | Attending: Family | Admitting: Family

## 2022-06-26 VITALS — BP 132/76 | HR 73 | Wt 346.0 lb

## 2022-06-26 DIAGNOSIS — I10 Essential (primary) hypertension: Secondary | ICD-10-CM

## 2022-06-26 DIAGNOSIS — Z6841 Body Mass Index (BMI) 40.0 and over, adult: Secondary | ICD-10-CM | POA: Diagnosis not present

## 2022-06-26 DIAGNOSIS — I5022 Chronic systolic (congestive) heart failure: Secondary | ICD-10-CM | POA: Diagnosis present

## 2022-06-26 DIAGNOSIS — Z7984 Long term (current) use of oral hypoglycemic drugs: Secondary | ICD-10-CM | POA: Insufficient documentation

## 2022-06-26 DIAGNOSIS — I428 Other cardiomyopathies: Secondary | ICD-10-CM | POA: Diagnosis not present

## 2022-06-26 DIAGNOSIS — Z79899 Other long term (current) drug therapy: Secondary | ICD-10-CM | POA: Insufficient documentation

## 2022-06-26 DIAGNOSIS — I110 Hypertensive heart disease with heart failure: Secondary | ICD-10-CM | POA: Diagnosis not present

## 2022-06-26 DIAGNOSIS — E785 Hyperlipidemia, unspecified: Secondary | ICD-10-CM | POA: Diagnosis not present

## 2022-06-26 DIAGNOSIS — M7989 Other specified soft tissue disorders: Secondary | ICD-10-CM | POA: Diagnosis not present

## 2022-06-26 NOTE — Patient Instructions (Signed)
We will call you after we get the echo scheduled

## 2022-06-26 NOTE — Progress Notes (Signed)
[facility] HEART FAILURE CLINIC - Pharmacist Education Note  Assessment John Lowe is a 42 y.o. male with HFmrEF (EF 41-49%) presenting to the Heart Failure Clinic for follow up. Patient reports no issues with medications and no barriers to access. He reports taking his weights every other day with negligible fluctuation (1-2 lbs).   Recent ED Visit (past 6 months): none  Guideline-Directed Medical Therapy/Evidence Based Medicine ACE/ARB/ARNI: Sacubitril/valsartan 49/51 mg BID Beta Blocker: Metoprolol succinate 100 mg daily Aldosterone Antagonist: Spironolactone 25 mg daily Diuretic: Furosemide 20 mg daily SGLT2i: Dapagliflozin 10 mg daily  Adherence Assessment Do you ever forget to take your medication? [] Yes [x] No  Do you ever skip doses due to side effects? [] Yes [x] No  Do you have trouble affording your medicines? [] Yes [x] No  Are you ever unable to pick up your medication due to transportation difficulties? [] Yes [x] No  Do you ever stop taking your medications because you don't believe they are helping? [] Yes [x] No  Do you check your weight daily? [] Yes [x] No (every other day)  Adherence strategy: Pillbox (morning/noon/night) Barriers to obtaining medications: none reported  Diagnostics ECHO: Date 05/15/2021, EF 45-50%, moderate LVH, suboptimal apical window   Vitals    06/26/2022    2:01 PM 03/28/2022    2:22 PM 03/07/2022    2:30 PM  Vitals with BMI  Weight 346 lbs 345 lbs 2 oz   Systolic 132 122 161  Diastolic 76 76 90  Pulse 73 67      Recent Labs    Latest Ref Rng & Units 03/28/2022    2:54 PM 03/07/2022    2:46 PM 02/08/2022    2:56 PM  BMP  Glucose 70 - 99 mg/dL 93  88  096   BUN 6 - 20 mg/dL 17  17  15    Creatinine 0.61 - 1.24 mg/dL 0.45  4.09  8.11   Sodium 135 - 145 mmol/L 138  138  137   Potassium 3.5 - 5.1 mmol/L 3.8  3.8  3.3   Chloride 98 - 111 mmol/L 103  101  108   CO2 22 - 32 mmol/L 28  27  27    Calcium 8.9 - 10.3 mg/dL 9.0  9.3  8.9      Past Medical History Past Medical History:  Diagnosis Date   Arthritis    CHF (congestive heart failure) (HCC)    Hyperlipidemia    Hypertension    Obesity, Class III, BMI 40-49.9 (morbid obesity) (HCC)     Plan CHF Continue dapagliflozin 10 mg daily, lasix 20 mg daily, metoprolol succinate 100 mg daily, Entresto 49/51 mg twice daily, and spironolactone 25 mg daily Repeat echocardiogram and titrate Entresto pending results   Time spent: 10 minutes  Celene Squibb, PharmD PGY1 Pharmacy Resident 06/26/2022 2:17 PM

## 2022-06-26 NOTE — Progress Notes (Signed)
Patient ID: John Lowe, male    DOB: [birth year], 42 y.o.   MRN: [id number]  Primary cardiologist: None PCP: [name], FNP (last seen 04/23)  HPI  Mr Cislo is a 42 y/o male with a history of hyperlipidemia, HTN, obesity and chronic heart failure.   Echo 05/15/21: EF of 45-50% along with moderate LVH and severe LAE.   Has not been admitted or been in the ED in the last 6 months.   He presents today with a chief complaint of a follow-up visit. Currently has no symptoms and specifically denies difficulty sleeping, dizziness, abdominal distention, palpitations, pedal edema, chest pain, SOB, cough, fatigue or weight gain.   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 Does notice some swelling in his legs at the end of this shift.   Past Medical History:  Diagnosis Date   Arthritis    CHF (congestive heart failure) (HCC)    Hyperlipidemia    Hypertension    Obesity, Class III, BMI 40-49.9 (morbid obesity) (HCC)    Past Surgical History:  Procedure Laterality Date   ANKLE SURGERY     rotator cuff Right    Family History  Problem Relation Age of Onset   Hypertension Mother    Hypertension Father    Heart disease Father    Kidney disease Father    Hypertension Sister    Hypertension Brother    Cancer Maternal Grandmother        breast   Cancer Maternal Grandfather        bone cancer   Hypertension Paternal Grandmother    Social History   Tobacco Use   Smoking status: Never   Smokeless tobacco: Never  Substance Use Topics   Alcohol use: No   No Known Allergies  Prior to Admission medications   Medication Sig Start Date End Date Taking? Authorizing Provider  aspirin 81 MG tablet Take 81 mg by mouth daily.   Yes [provider]  atorvastatin (LIPITOR) 40 MG tablet TAKE 1 TABLET BY MOUTH AT BEDTIME 06/11/22  Yes [name], FNP  dapagliflozin propanediol (FARXIGA) 10 MG TABS tablet Take 1 tablet (10 mg total) by mouth daily  before breakfast. 02/19/22  Yes [name], FNP  furosemide (LASIX) 40 MG tablet Take 0.5 tablets (20 mg total) by mouth daily. And additional 1/2 tablet if needed for weight gain or swelling 09/14/21  Yes Xyler Terpening A, FNP  isosorbide-hydrALAZINE (BIDIL) 20-37.5 MG tablet Take 1 tablet by mouth 3 (three) times daily. 01/27/22  Yes [name], FNP  metoprolol succinate (TOPROL-XL) 100 MG 24 hr tablet Take 1 tablet (100 mg total) by mouth daily. Take with or immediately following a meal. 02/08/22  Yes Stevie Ertle A, FNP  sacubitril-valsartan (ENTRESTO) 49-51 MG Take 1 tablet by mouth 2 (two) times daily. 03/07/22  Yes [name], FNP  spironolactone (ALDACTONE) 25 MG tablet Take 1 tablet by mouth once daily 06/22/22  Yes [name], FNP   Review of Systems  Constitutional:  Negative for appetite change and fatigue.  HENT:  Negative for congestion, postnasal drip and sore throat.   Eyes: Negative.   Respiratory:  Negative for cough, chest tightness and shortness of breath.   Cardiovascular:  Negative for chest pain, palpitations and leg swelling.  Gastrointestinal:  Negative for abdominal distention and abdominal pain.  Endocrine: Negative.   Genitourinary: Negative.   Musculoskeletal:  Negative for back pain and neck  pain.  Skin: Negative.   Allergic/Immunologic: Negative.   Neurological:  Negative for dizziness and light-headedness.  Hematological:  Negative for adenopathy. Does not bruise/bleed easily.  Psychiatric/Behavioral:  Negative for dysphoric mood and sleep disturbance (sleeping on 2 pillows). The patient is not nervous/anxious.    Vitals:   06/26/22 1401  BP: 132/76  Pulse: 73  SpO2: 100%  Weight: (!) 346 lb (156.9 kg)   Wt Readings from Last 3 Encounters:  06/26/22 (!) 346 lb (156.9 kg)  03/28/22 (!) 345 lb 2 oz (156.5 kg)  03/07/22 (!) 344 lb (156 kg)   Lab Results  Component Value Date   CREATININE 1.12 03/28/2022   CREATININE 1.25 (H)  03/07/2022   CREATININE 1.23 02/08/2022   Physical Exam Vitals and nursing note reviewed.  Constitutional:      Appearance: Normal appearance.  HENT:     Head: Normocephalic and atraumatic.  Cardiovascular:     Rate and Rhythm: Normal rate and regular rhythm.  Pulmonary:     Effort: Pulmonary effort is normal. No respiratory distress.     Breath sounds: No wheezing or rales.  Abdominal:     General: There is no distension.     Palpations: Abdomen is soft.     Tenderness: There is no abdominal tenderness.  Musculoskeletal:        General: No tenderness.     Cervical back: Normal range of motion and neck supple.     Right lower leg: No edema.     Left lower leg: No edema.  Skin:    General: Skin is warm and dry.  Neurological:     General: No focal deficit present.     Mental Status: He is alert and oriented to person, place, and time.  Psychiatric:        Mood and Affect: Mood normal.        Behavior: Behavior normal.        Thought Content: Thought content normal.   Assessment & Plan:  1: NICM with mildly reduced ejection fraction- - NYHA class I - euvolemic today - weighing daily; reminded to call for an overnight weight gain of > 2 pounds or a weekly weight gain of > 5 pounds - weight stable from last visit here 3 months ago - Echo 05/15/21: EF of 45-50% along with moderate LVH and severe LAE.  - will get updated echo scheduled and f/u here a week afterwards - HF likely due to HTN - EKG today: NSR, unchanged from previous one 03/23 - not adding salt and has been reading food labels for sodium content; reviewed the importance of keeping daily sodium to < 2000mg  / day - keeping his daily fluid intake to between 60-64 ounces - continue entresto 49/51mg  BID - continue metoprolol 100mg  daily - continue spironolactone 25mg  daily - continue farxiga 10mg  daily - BNP 05/14/21 was 286.7 - PharmD reconciled meds w/ patient  2: HTN- - BP 132/76 - saw PCP Suzie Portela) 04/23 -  BMP 03/28/22 reviewed and showed sodium 138, potassium 3.8, creatinine 1.12 and GFR >60  Return in 2 months after echo, sooner if needed.

## 2022-07-06 ENCOUNTER — Other Ambulatory Visit: Payer: Self-pay | Admitting: Family Medicine

## 2022-07-06 NOTE — Telephone Encounter (Signed)
Requested medication (s) are due for refill today: yes  Requested medication (s) are on the active medication list: yes  Last refill:  06/11/22  Future visit scheduled: yes  Notes to clinic:  Unable to refill per protocol, courtesy refill already given, routing for provider approval.      Requested Prescriptions  Pending Prescriptions Disp Refills   atorvastatin (LIPITOR) 40 MG tablet [Pharmacy Med Name: Atorvastatin Calcium 40 MG Oral Tablet] 30 tablet 0    Sig: TAKE 1 TABLET BY MOUTH AT BEDTIME     Cardiovascular:  Antilipid - Statins Failed - 07/06/2022  9:12 AM      Failed - Valid encounter within last 12 months    Recent Outpatient Visits           1 year ago Hospital discharge follow-up   [facility] [name], FNP   4 years ago Essential hypertension   [place] [facility] Lada, Janit Bern, MD   5 years ago Essential hypertension   [place] [facility], Janit Bern, MD   5 years ago Mixed hyperlipidemia   [facility], Janit Bern, MD   6 years ago Pharyngitis, unspecified etiology   [place] [facility] [place], Janit Bern, MD              Failed - Lipid Panel in normal range within the last 12 months    Cholesterol, Total  Date Value Ref Range Status  09/23/2014 208 (H) 100 - 199 mg/dL Final   Cholesterol  Date Value Ref Range Status  05/16/2021 209 (H) 0 - 200 mg/dL Final   LDL Cholesterol (Calc)  Date Value Ref Range Status  03/10/2018 176 (H) mg/dL (calc) Final    Comment:    Reference range: <100 . Desirable range <100 mg/dL for primary prevention;   <70 mg/dL for patients with CHD or diabetic patients  with > or = 2 CHD risk factors. Marland Kitchen LDL-C is now calculated using the Martin-Hopkins  calculation, which is a validated novel method providing  better accuracy than the Friedewald equation in the  estimation of LDL-C.  Horald Pollen et al.  Lenox Ahr. 1610;960(45): 2061-2068  (http://education.[facility].com/faq/FAQ164)    LDL Cholesterol  Date Value Ref Range Status  05/16/2021 151 (H) 0 - 99 mg/dL Final    Comment:           Total Cholesterol/HDL:CHD Risk Coronary Heart Disease Risk Table                     Men   Women  1/2 Average Risk   3.4   3.3  Average Risk       5.0   4.4  2 X Average Risk   9.6   7.1  3 X Average Risk  23.4   11.0        Use the calculated Patient Ratio above and the CHD Risk Table to determine the patient's CHD Risk.        ATP III CLASSIFICATION (LDL):  <100     mg/dL   Optimal  409-811  mg/dL   Near or Above                    Optimal  130-159  mg/dL   Borderline  914-782  mg/dL   High  >956     mg/dL   Very High Performed at [facility], [address] [place], [place] [address]    HDL  Date Value Ref Range Status  05/16/2021 41 >40 mg/dL Final  91/47/8295 46 >62 mg/dL Final    Comment:    According to ATP-III Guidelines, HDL-C >59 mg/dL is considered a negative risk factor for CHD.    Triglycerides  Date Value Ref Range Status  05/16/2021 83 <150 mg/dL Final         Passed - Patient is not pregnant

## 2022-07-12 ENCOUNTER — Other Ambulatory Visit: Payer: Self-pay | Admitting: Family Medicine

## 2022-07-13 NOTE — Telephone Encounter (Signed)
Requested medication (s) are due for refill today: yes  Requested medication (s) are on the active medication list: yes  Last refill:  06/11/22 #30=courtesy refill  Future visit scheduled:no  Notes to clinic:  called pt and LM on VM to call back to schedule office visit/CPE    Requested Prescriptions  Pending Prescriptions Disp Refills   atorvastatin (LIPITOR) 40 MG tablet [Pharmacy Med Name: Atorvastatin Calcium 40 MG Oral Tablet] 30 tablet 0    Sig: TAKE 1 TABLET BY MOUTH AT BEDTIME     Cardiovascular:  Antilipid - Statins Failed - 07/12/2022  6:39 PM      Failed - Valid encounter within last 12 months    Recent Outpatient Visits           1 year ago Hospital discharge follow-up   [facility] [name], FNP   4 years ago Essential hypertension   [place] [facility] Lada, Janit Bern, MD   5 years ago Essential hypertension   [place] [facility] Lada, Janit Bern, MD   5 years ago Mixed hyperlipidemia   [facility], Janit Bern, MD   6 years ago Pharyngitis, unspecified etiology   [place] [facility] [place], Janit Bern, MD              Failed - Lipid Panel in normal range within the last 12 months    Cholesterol, Total  Date Value Ref Range Status  09/23/2014 208 (H) 100 - 199 mg/dL Final   Cholesterol  Date Value Ref Range Status  05/16/2021 209 (H) 0 - 200 mg/dL Final   LDL Cholesterol (Calc)  Date Value Ref Range Status  03/10/2018 176 (H) mg/dL (calc) Final    Comment:    Reference range: <100 . Desirable range <100 mg/dL for primary prevention;   <70 mg/dL for patients with CHD or diabetic patients  with > or = 2 CHD risk factors. Marland Kitchen LDL-C is now calculated using the Martin-Hopkins  calculation, which is a validated novel method providing  better accuracy than the Friedewald equation in the  estimation of LDL-C.  Horald Pollen et al. Lenox Ahr.  0865;784(69): 2061-2068  (http://education.[facility].com/faq/FAQ164)    LDL Cholesterol  Date Value Ref Range Status  05/16/2021 151 (H) 0 - 99 mg/dL Final    Comment:           Total Cholesterol/HDL:CHD Risk Coronary Heart Disease Risk Table                     Men   Women  1/2 Average Risk   3.4   3.3  Average Risk       5.0   4.4  2 X Average Risk   9.6   7.1  3 X Average Risk  23.4   11.0        Use the calculated Patient Ratio above and the CHD Risk Table to determine the patient's CHD Risk.        ATP III CLASSIFICATION (LDL):  <100     mg/dL   Optimal  629-528  mg/dL   Near or Above                    Optimal  130-159  mg/dL   Borderline  413-244  mg/dL   High  >010     mg/dL   Very High Performed at [facility], [address] [place], [place] [address]    HDL  Date Value Ref Range Status  05/16/2021 41 >40 mg/dL Final  19/14/7829 46 >56 mg/dL Final    Comment:    According to ATP-III Guidelines, HDL-C >59 mg/dL is considered a negative risk factor for CHD.    Triglycerides  Date Value Ref Range Status  05/16/2021 83 <150 mg/dL Final         Passed - Patient is not pregnant

## 2022-07-16 ENCOUNTER — Other Ambulatory Visit: Payer: Self-pay | Admitting: Family Medicine

## 2022-07-17 NOTE — Telephone Encounter (Signed)
Requested medications are due for refill today.  yes  Requested medications are on the active medications list.  yes  Last refill. 06/11/2022 #30 [id number]  Future visit scheduled.   no  Notes to clinic.  Pt already given a courtesy refill.     Requested Prescriptions  Pending Prescriptions Disp Refills   atorvastatin (LIPITOR) 40 MG tablet [Pharmacy Med Name: Atorvastatin Calcium 40 MG Oral Tablet] 30 tablet 0    Sig: TAKE 1 TABLET BY MOUTH AT BEDTIME     Cardiovascular:  Antilipid - Statins Failed - 07/16/2022  6:43 PM      Failed - Valid encounter within last 12 months    Recent Outpatient Visits           1 year ago Hospital discharge follow-up   [facility] [name], FNP   4 years ago Essential hypertension   [facility] Lada, Janit Bern, MD   5 years ago Essential hypertension   [facility] Lada, Janit Bern, MD   5 years ago Mixed hyperlipidemia   [facility] Lada, Janit Bern, MD   6 years ago Pharyngitis, unspecified etiology   [facility] Bassett, Janit Bern, MD              Failed - Lipid Panel in normal range within the last 12 months    Cholesterol, Total  Date Value Ref Range Status  09/23/2014 208 (H) 100 - 199 mg/dL Final   Cholesterol  Date Value Ref Range Status  05/16/2021 209 (H) 0 - 200 mg/dL Final   LDL Cholesterol (Calc)  Date Value Ref Range Status  03/10/2018 176 (H) mg/dL (calc) Final    Comment:    Reference range: <100 . Desirable range <100 mg/dL for primary prevention;   <70 mg/dL for patients with CHD or diabetic patients  with > or = 2 CHD risk factors. Marland Kitchen LDL-C is now calculated using the Martin-Hopkins  calculation, which is a validated novel method providing  better accuracy than the Friedewald equation in the  estimation of LDL-C.  Horald Pollen et al. Lenox Ahr. 9562;130(86): 2061-2068   (http://education.[facility].com/faq/FAQ164)    LDL Cholesterol  Date Value Ref Range Status  05/16/2021 151 (H) 0 - 99 mg/dL Final    Comment:           Total Cholesterol/HDL:CHD Risk Coronary Heart Disease Risk Table                     Men   Women  1/2 Average Risk   3.4   3.3  Average Risk       5.0   4.4  2 X Average Risk   9.6   7.1  3 X Average Risk  23.4   11.0        Use the calculated Patient Ratio above and the CHD Risk Table to determine the patient's CHD Risk.        ATP III CLASSIFICATION (LDL):  <100     mg/dL   Optimal  578-469  mg/dL   Near or Above                    Optimal  130-159  mg/dL   Borderline  629-528  mg/dL   High  >413     mg/dL   Very High Performed at [facility], [address], [place], [place] [address]  HDL  Date Value Ref Range Status  05/16/2021 41 >40 mg/dL Final  40/98/1191 46 >47 mg/dL Final    Comment:    According to ATP-III Guidelines, HDL-C >59 mg/dL is considered a negative risk factor for CHD.    Triglycerides  Date Value Ref Range Status  05/16/2021 83 <150 mg/dL Final         Passed - Patient is not pregnant

## 2022-07-23 IMAGING — CR DG CHEST 2V
1 series · 2 of 2 positions shown · non-contrast
Comparison: 05/09/2012

CLINICAL DATA: Dyspnea.  Evaluate for pulmonary congestion.

EXAM:
CHEST - 2 VIEW

[Series 1: dg chest 2 view · 0.14mm/px · 2 of 2 slices shown]
[im 1/2]
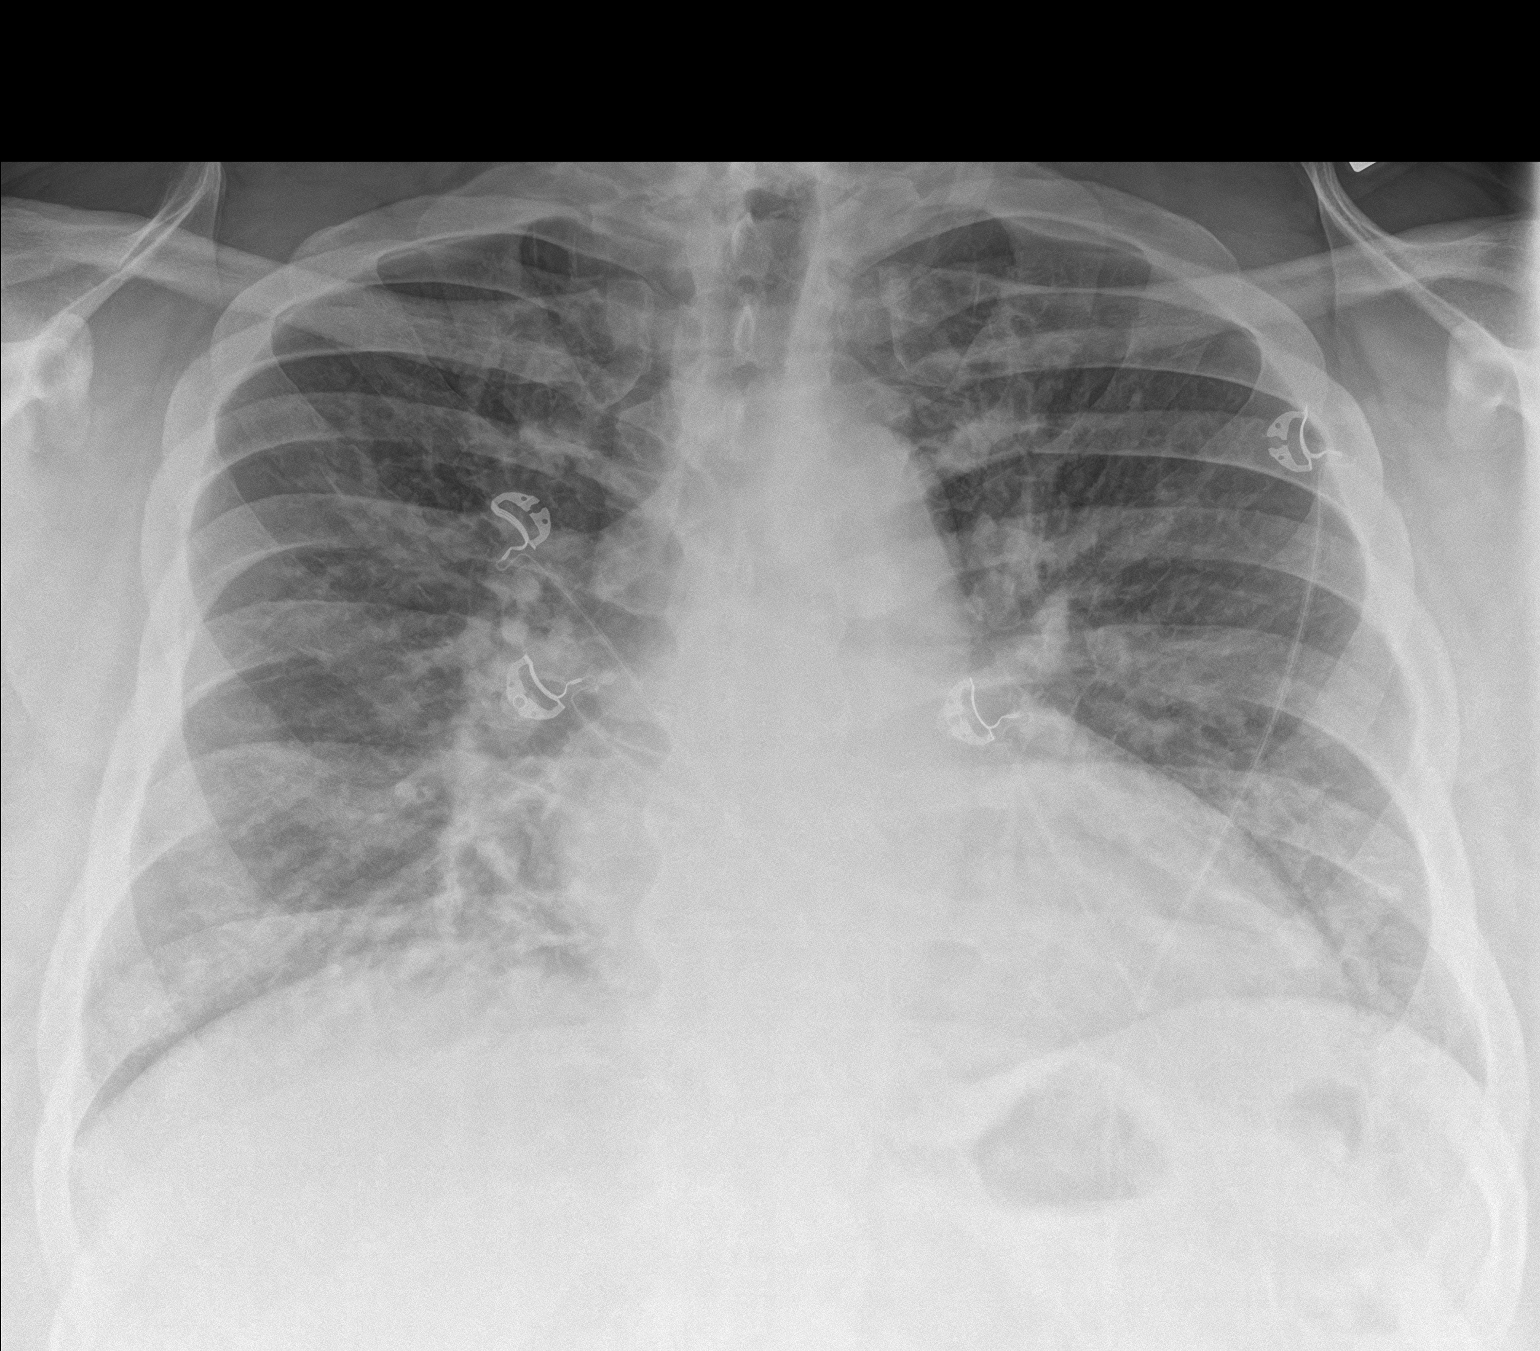
[im 2/2]
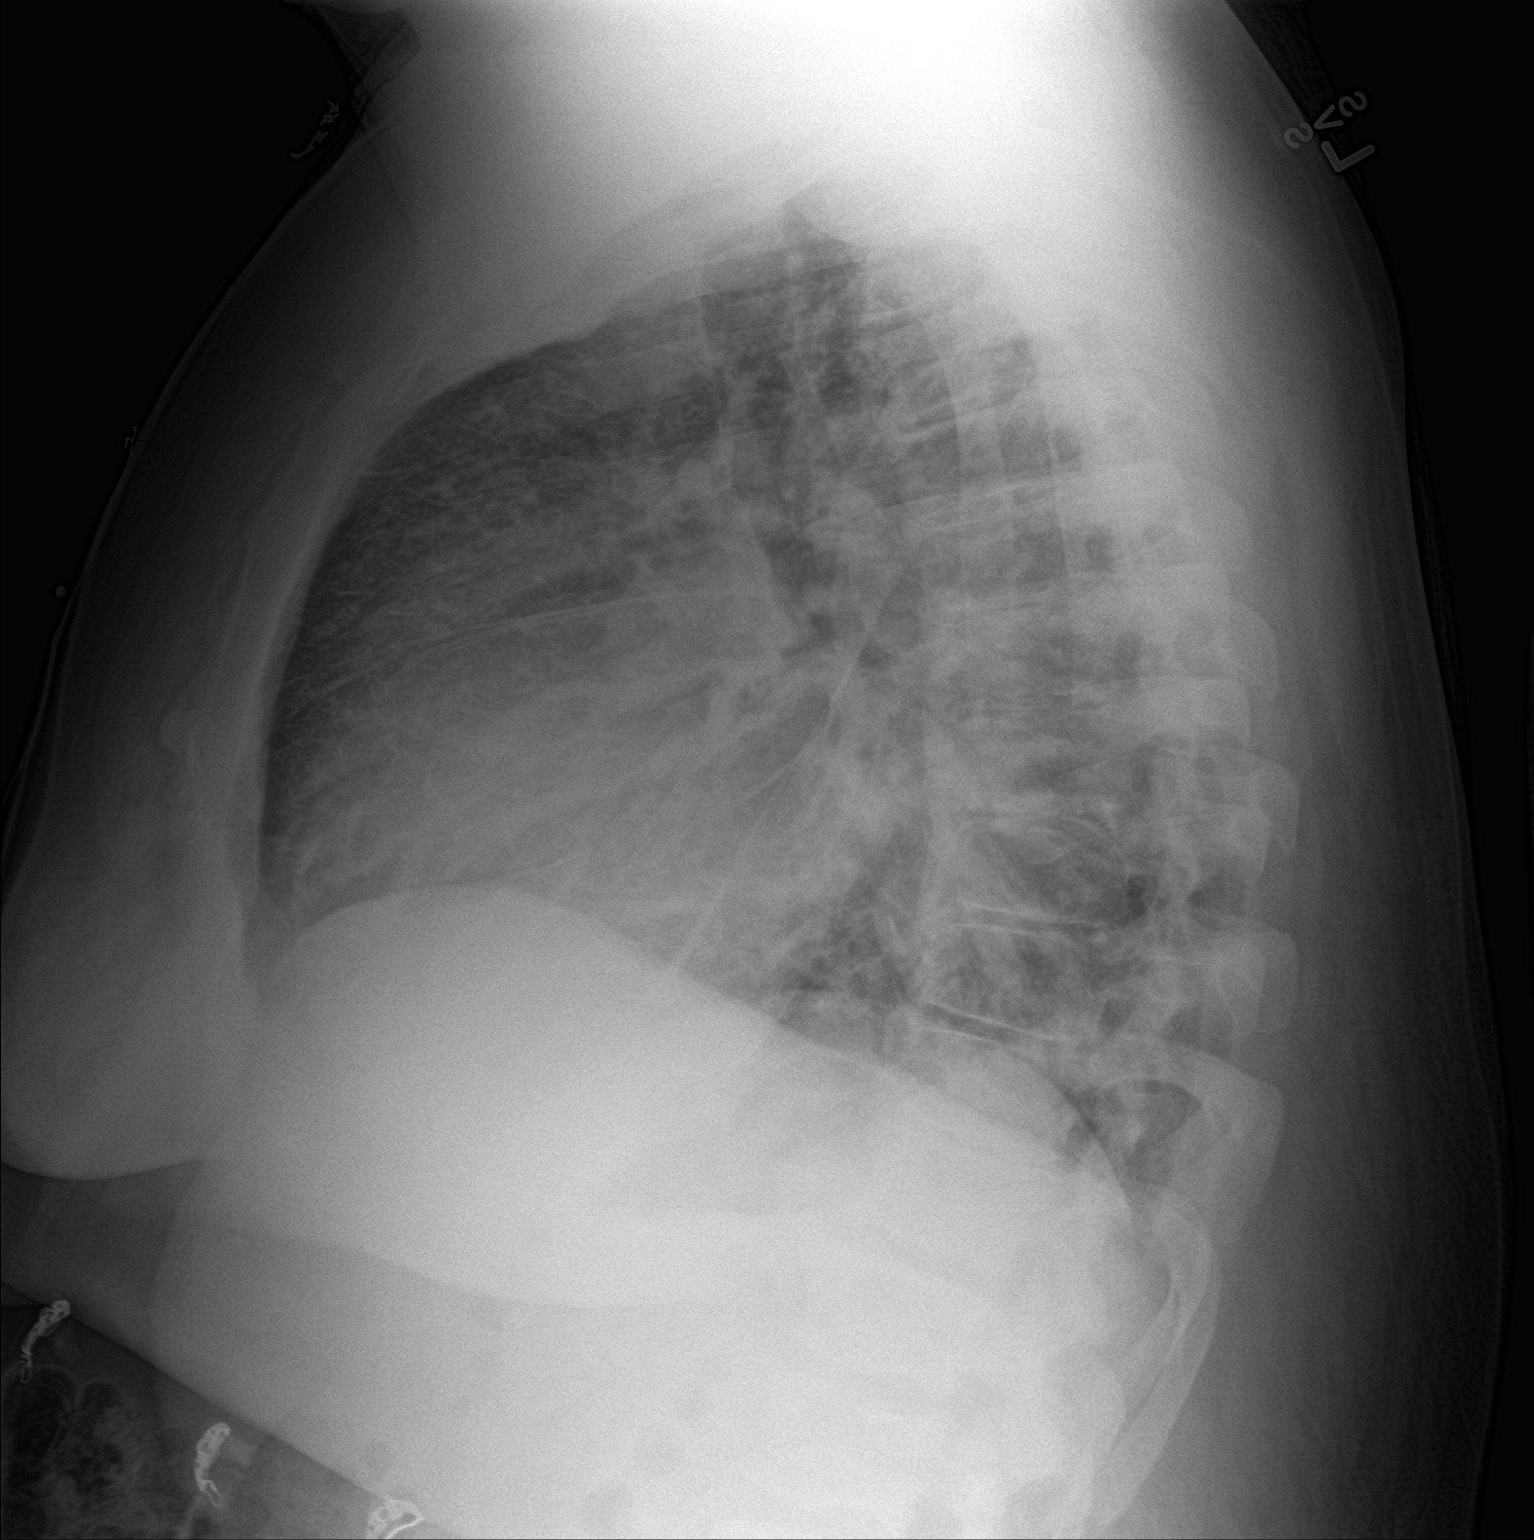

[2 of 2 positions shown; findings below may reference images not displayed]

FINDINGS: Mild cardiac enlargement. Low lung volumes. Diffuse pulmonary
vascular congestion. No pleural effusion. No airspace consolidation.
Visualized osseous structures are unremarkable.
IMPRESSION: Cardiac enlargement and pulmonary vascular congestion.

## 2022-07-26 ENCOUNTER — Ambulatory Visit: Admission: RE | Admit: 2022-07-26 | Payer: No Typology Code available for payment source | Source: Ambulatory Visit

## 2022-08-02 ENCOUNTER — Ambulatory Visit: Payer: No Typology Code available for payment source | Attending: Family | Admitting: Family

## 2022-08-02 ENCOUNTER — Encounter: Payer: Self-pay | Admitting: Family

## 2022-08-02 VITALS — BP 127/77 | HR 74 | Wt 348.4 lb

## 2022-08-02 DIAGNOSIS — I5022 Chronic systolic (congestive) heart failure: Secondary | ICD-10-CM

## 2022-08-02 DIAGNOSIS — E785 Hyperlipidemia, unspecified: Secondary | ICD-10-CM | POA: Insufficient documentation

## 2022-08-02 DIAGNOSIS — I110 Hypertensive heart disease with heart failure: Secondary | ICD-10-CM | POA: Insufficient documentation

## 2022-08-02 DIAGNOSIS — Z79899 Other long term (current) drug therapy: Secondary | ICD-10-CM | POA: Diagnosis not present

## 2022-08-02 DIAGNOSIS — I509 Heart failure, unspecified: Secondary | ICD-10-CM | POA: Insufficient documentation

## 2022-08-02 DIAGNOSIS — I10 Essential (primary) hypertension: Secondary | ICD-10-CM | POA: Diagnosis not present

## 2022-08-02 DIAGNOSIS — I428 Other cardiomyopathies: Secondary | ICD-10-CM | POA: Insufficient documentation

## 2022-08-02 MED ORDER — ATORVASTATIN CALCIUM 40 MG PO TABS: 40.0000 mg | ORAL_TABLET | Freq: Every day | ORAL | 5 refills | Status: DC

## 2022-08-02 NOTE — Progress Notes (Signed)
PCP: [name], FNP (last seen 04/23) Primary Cardiologist: None  HPI:  [name] is a 42 y/o male with a history of hyperlipidemia, HTN, obesity and chronic heart failure.   Echo 05/15/21: EF of 45-50% along with moderate LVH and severe LAE.   Has not been admitted or been in the ED in the last 6 months.   He presents today with a chief complaint of a follow-up visit. Currently denies fatigue, SOB, cough, chest pain, edema, palpitations, dizziness or difficulty sleeping. Does report a slight gradual weight gain due to eating a large meal ~ 5pm and then not being very active in the evening. Also doesn't eat much during the day because he's so busy at work. Did not get his echo done because he didn't remember about it until the day it was scheduled and, by then, it was too late to go.   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 Does notice some swelling in his legs at the end of this shift.    ROS: All systems negative except as listed in HPI, PMH and Problem List.  SH:  Social History   Socioeconomic History   Marital status: Married    Spouse name: Not on file   Number of children: Not on file   Years of education: Not on file   Highest education level: Not on file  Occupational History   Not on file  Tobacco Use   Smoking status: Never   Smokeless tobacco: Never  Vaping Use   Vaping Use: Never used  Substance and Sexual Activity   Alcohol use: No   Drug use: No   Sexual activity: Not Currently  Other Topics Concern   Not on file  Social History Narrative   Not on file   Social Determinants of Health   Financial Resource Strain: Not on file  Food Insecurity: Not on file  Transportation Needs: Not on file  Physical Activity: Not on file  Stress: Not on file  Social Connections: Not on file  Intimate Partner Violence: Not on file    FH:  Family History  Problem Relation Age of Onset   Hypertension Mother    Hypertension Father     Heart disease Father    Kidney disease Father    Hypertension Sister    Hypertension Brother    Cancer Maternal Grandmother        breast   Cancer Maternal Grandfather        bone cancer   Hypertension Paternal Grandmother     Past Medical History:  Diagnosis Date   Arthritis    CHF (congestive heart failure) (HCC)    Hyperlipidemia    Hypertension    Obesity, Class III, BMI 40-49.9 (morbid obesity) (HCC)     Current Outpatient Medications  Medication Sig Dispense Refill   aspirin 81 MG tablet Take 81 mg by mouth daily.     atorvastatin (LIPITOR) 40 MG tablet TAKE 1 TABLET BY MOUTH AT BEDTIME 30 tablet 0   dapagliflozin propanediol (FARXIGA) 10 MG TABS tablet Take 1 tablet (10 mg total) by mouth daily before breakfast. 30 tablet 5   furosemide (LASIX) 40 MG tablet Take 0.5 tablets (20 mg total) by mouth daily. And additional 1/2 tablet if needed for weight gain or swelling 100 tablet 3   isosorbide-hydrALAZINE (BIDIL) 20-37.5 MG tablet Take 1 tablet by mouth 3 (three) times daily. 90 tablet 5   metoprolol succinate (TOPROL-XL) 100  MG 24 hr tablet Take 1 tablet (100 mg total) by mouth daily. Take with or immediately following a meal. 90 tablet 3   sacubitril-valsartan (ENTRESTO) 49-51 MG Take 1 tablet by mouth 2 (two) times daily. 60 tablet 5   spironolactone (ALDACTONE) 25 MG tablet Take 1 tablet by mouth once daily 30 tablet 5   No current facility-administered medications for this visit.   Vitals:   08/02/22 1512  BP: 127/77  Pulse: 74  SpO2: 98%  Weight: (!) 348 lb 6.4 oz (158 kg)   Wt Readings from Last 3 Encounters:  08/02/22 (!) 348 lb 6.4 oz (158 kg)  06/26/22 (!) 346 lb (156.9 kg)  03/28/22 (!) 345 lb 2 oz (156.5 kg)   Lab Results  Component Value Date   CREATININE 1.12 03/28/2022   CREATININE 1.25 (H) 03/07/2022   CREATININE 1.23 02/08/2022   PHYSICAL EXAM:  General:  Well appearing. No resp difficulty HEENT: normal Neck: supple. JVP flat.. No  lymphadenopathy or thryomegaly appreciated. Cor: PMI normal. Regular rate & rhythm. No rubs, gallops or murmurs. Lungs: clear Abdomen: soft, nontender, nondistended. No hepatosplenomegaly. No bruits or masses.  Extremities: no cyanosis, clubbing, rash, edema Neuro: alert & orientedx3, cranial nerves grossly intact. Moves all 4 extremities w/o difficulty. Affect pleasant.   ECG: not done   ASSESSMENT & PLAN:  1: NICM with mildly reduced ejection fraction- - HF likely due to HTN - NYHA class I - euvolemic today - weighing daily; reminded to call for an overnight weight gain of > 2 pounds or a weekly weight gain of > 5 pounds - weight up 2 pounds from last visit here 5 weeks ago; reviewed the importance of eating more frequently during the day and less of a large meal in the evening - Echo 05/15/21: EF of 45-50% along with moderate LVH and severe LAE.  - was a NS for recent echo as he forgot; this has been R/S to 09/06/22 - not adding salt and has been reading food labels for sodium content; reviewed the importance of keeping daily sodium to < 2000mg  / day - keeping his daily fluid intake to between 60-64 ounces - continue entresto 49/51mg  BID - continue metoprolol 100mg  daily - continue spironolactone 25mg  daily - continue farxiga 10mg  daily - BMP prior to next visit as patient says that he can't stay today to get labs drawn  - BNP 05/14/21 was 286.7  2: HTN- - BP 127/77 - saw PCP Suzie Portela) 04/23 - BMP 03/28/22 reviewed and showed sodium 138, potassium 3.8, creatinine 1.12 and GFR >60 - BMP ordered and he says that he'll get it done before next visit  Return in 1 week, sooner if needed.

## 2022-08-02 NOTE — Patient Instructions (Signed)
Please come for lab work at the [facility] in 2-3 weeks.   Your physician has requested that you have an echocardiogram. Echocardiography is a painless test that uses sound waves to create images of your heart. It provides your doctor with information about the size and shape of your heart and how well your heart's chambers and valves are working. This procedure takes approximately one hour. There are no restrictions for this procedure. Please arrive to the [facility] at 10:45 AM on Thursday, July 11th for an 11 AM appointment.  Please do NOT wear cologne, perfume, aftershave, or lotions (deodorant is allowed). Please arrive 15 minutes prior to your appointment time.

## 2022-09-03 ENCOUNTER — Other Ambulatory Visit
Admission: RE | Admit: 2022-09-03 | Discharge: 2022-09-03 | Disposition: A | Discharge status: Home / Self Care | Payer: No Typology Code available for payment source | Attending: Family | Admitting: Family

## 2022-09-03 DIAGNOSIS — I5022 Chronic systolic (congestive) heart failure: Secondary | ICD-10-CM | POA: Diagnosis not present

## 2022-09-03 LAB — BASIC METABOLIC PANEL
Anion gap: 8 (ref 5–15)
BUN: 18 mg/dL (ref 6–20)
CO2: 25 mmol/L (ref 22–32)
Calcium: 9.1 mg/dL (ref 8.9–10.3)
Chloride: 105 mmol/L (ref 98–111)
Creatinine, Ser: 1.26 mg/dL — ABNORMAL HIGH (ref 0.61–1.24)
GFR, Estimated: >60 mL/min (ref >60)
Glucose, Bld: 93 mg/dL (ref 70–99)
Potassium: 4 mmol/L (ref 3.5–5.1)
Sodium: 138 mmol/L (ref 135–145)

## 2022-09-06 ENCOUNTER — Ambulatory Visit
Admission: RE | Admit: 2022-09-06 | Discharge: 2022-09-06 | Disposition: A | Discharge status: Home / Self Care | Payer: No Typology Code available for payment source | Source: Ambulatory Visit | Attending: Family | Admitting: Family

## 2022-09-06 ENCOUNTER — Encounter: Payer: No Typology Code available for payment source | Admitting: Family

## 2022-09-06 DIAGNOSIS — I509 Heart failure, unspecified: Secondary | ICD-10-CM | POA: Insufficient documentation

## 2022-09-06 DIAGNOSIS — I110 Hypertensive heart disease with heart failure: Secondary | ICD-10-CM | POA: Insufficient documentation

## 2022-09-06 DIAGNOSIS — E669 Obesity, unspecified: Secondary | ICD-10-CM | POA: Diagnosis not present

## 2022-09-06 DIAGNOSIS — I5022 Chronic systolic (congestive) heart failure: Secondary | ICD-10-CM

## 2022-09-06 DIAGNOSIS — E785 Hyperlipidemia, unspecified: Secondary | ICD-10-CM | POA: Diagnosis not present

## 2022-09-06 LAB — ECHOCARDIOGRAM COMPLETE
AR max vel: 2.91 cm2
AV Area VTI: 3.14 cm2
AV Area mean vel: 2.71 cm2
AV Mean grad: 3 mmHg
AV Peak grad: 6.1 mmHg
Ao pk vel: 1.23 m/s
Area-P 1/2: 2.36 cm2
Calc EF: 50.4 %
MV VTI: 4 cm2
S' Lateral: 3.1 cm
Single Plane A2C EF: 50.9 %
Single Plane A4C EF: 49.9 %

## 2022-09-06 NOTE — Progress Notes (Signed)
*  PRELIMINARY RESULTS* Echocardiogram 2D Echocardiogram has been performed.  Carolyne Fiscal 09/06/2022, 11:58 AM

## 2022-09-13 ENCOUNTER — Ambulatory Visit: Payer: No Typology Code available for payment source | Attending: Family | Admitting: Family

## 2022-09-13 ENCOUNTER — Encounter: Payer: Self-pay | Admitting: Family

## 2022-09-13 VITALS — BP 131/84 | HR 65 | Wt 349.8 lb

## 2022-09-13 DIAGNOSIS — E785 Hyperlipidemia, unspecified: Secondary | ICD-10-CM | POA: Diagnosis not present

## 2022-09-13 DIAGNOSIS — I110 Hypertensive heart disease with heart failure: Secondary | ICD-10-CM | POA: Diagnosis not present

## 2022-09-13 DIAGNOSIS — I5032 Chronic diastolic (congestive) heart failure: Secondary | ICD-10-CM | POA: Diagnosis not present

## 2022-09-13 DIAGNOSIS — I10 Essential (primary) hypertension: Secondary | ICD-10-CM | POA: Diagnosis not present

## 2022-09-13 DIAGNOSIS — I428 Other cardiomyopathies: Secondary | ICD-10-CM | POA: Insufficient documentation

## 2022-09-13 DIAGNOSIS — Z7984 Long term (current) use of oral hypoglycemic drugs: Secondary | ICD-10-CM | POA: Diagnosis not present

## 2022-09-13 DIAGNOSIS — Z79899 Other long term (current) drug therapy: Secondary | ICD-10-CM | POA: Diagnosis not present

## 2022-09-13 MED ORDER — WEGOVY 0.25 MG/0.5ML ~~LOC~~ SOAJ: 0.2500 mg | SUBCUTANEOUS | 5 refills | Status: DC

## 2022-09-13 NOTE — Patient Instructions (Signed)
I've sent the wegovy prescription to the pharmacy and if it gets approved, you will hear from [place].

## 2022-09-13 NOTE — Progress Notes (Signed)
PCP: [name], FNP (last seen 04/23) Primary Cardiologist: None  HPI:  John Lowe is a 42 y/o male with a history of hyperlipidemia, HTN, obesity and chronic heart failure.   Has not been admitted or been in the ED in the last 6 months.   Echo 05/15/21: EF of 45-50% along with moderate LVH and severe LAE.  Echo 09/06/22: EF 60-65% with mild LVH, Grade I DD, moderate LAE and mild John.   He presents today with a chief complaint of a follow-up visit. Currently has no symptoms and specifically denies any fatigue, SOB, chest pain, cough, palpitations, abdominal distention, pedal edema, dizziness or difficulty sleeping. Says that the air conditioning at work has been out for the last 2 weeks. 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 Does notice some swelling in his legs at the end of this shift.   Gained another pound from last visit here and says that he knows that he needs to eat more. Says that he tends to eat a pack of crackers in the morning with some milk so that he can take his medication. Rarely eats lunch because he's so busy and, right now, it's too hot in there. Then he will get home and be hungry and then tends to overeat. The other day he was eating doritos and cake when he got off work.   Looking forward to going on a cruise to [place] in August  ROS: All systems negative except as listed in HPI, PMH and Problem List.  SH:  Social History   Socioeconomic History   Marital status: Married    Spouse name: Not on file   Number of children: Not on file   Years of education: Not on file   Highest education level: Not on file  Occupational History   Not on file  Tobacco Use   Smoking status: Never   Smokeless tobacco: Never  Vaping Use   Vaping status: Never Used  Substance and Sexual Activity   Alcohol use: No   Drug use: No   Sexual activity: Not Currently  Other Topics Concern   Not on file  Social History Narrative   Not on file   Social  Determinants of Health   Financial Resource Strain: Not on file  Food Insecurity: Not on file  Transportation Needs: Not on file  Physical Activity: Not on file  Stress: Not on file  Social Connections: Not on file  Intimate Partner Violence: Not on file    FH:  Family History  Problem Relation Age of Onset   Hypertension Mother    Hypertension Father    Heart disease Father    Kidney disease Father    Hypertension Sister    Hypertension Brother    Cancer Maternal Grandmother        breast   Cancer Maternal Grandfather        bone cancer   Hypertension Paternal Grandmother     Past Medical History:  Diagnosis Date   Arthritis    CHF (congestive heart failure) (HCC)    Hyperlipidemia    Hypertension    Obesity, Class III, BMI 40-49.9 (morbid obesity) (HCC)     Current Outpatient Medications  Medication Sig Dispense Refill   aspirin 81 MG tablet Take 81 mg by mouth daily.     atorvastatin (LIPITOR) 40 MG tablet Take 1 tablet (40 mg total) by mouth at bedtime. 30 tablet 5   dapagliflozin propanediol (FARXIGA)  10 MG TABS tablet Take 1 tablet (10 mg total) by mouth daily before breakfast. 30 tablet 5   furosemide (LASIX) 40 MG tablet Take 0.5 tablets (20 mg total) by mouth daily. And additional 1/2 tablet if needed for weight gain or swelling 100 tablet 3   isosorbide-hydrALAZINE (BIDIL) 20-37.5 MG tablet Take 1 tablet by mouth 3 (three) times daily. 90 tablet 5   metoprolol succinate (TOPROL-XL) 100 MG 24 hr tablet Take 1 tablet (100 mg total) by mouth daily. Take with or immediately following a meal. 90 tablet 3   sacubitril-valsartan (ENTRESTO) 49-51 MG Take 1 tablet by mouth 2 (two) times daily. 60 tablet 5   spironolactone (ALDACTONE) 25 MG tablet Take 1 tablet by mouth once daily 30 tablet 5   No current facility-administered medications for this visit.   Vitals:   09/13/22 1404  BP: 131/84  Pulse: 65  SpO2: 100%  Weight: (!) 349 lb 12.8 oz (158.7 kg)   Wt  Readings from Last 3 Encounters:  09/13/22 (!) 349 lb 12.8 oz (158.7 kg)  08/02/22 (!) 348 lb 6.4 oz (158 kg)  06/26/22 (!) 346 lb (156.9 kg)   Lab Results  Component Value Date   CREATININE 1.26 (H) 09/03/2022   CREATININE 1.12 03/28/2022   CREATININE 1.25 (H) 03/07/2022   PHYSICAL EXAM:  General:  Well appearing. No resp difficulty HEENT: normal Neck: supple. JVP flat.. No lymphadenopathy or thryomegaly appreciated. Cor: PMI normal. Regular rate & rhythm. No rubs, gallops or murmurs. Lungs: clear Abdomen: soft, nontender, nondistended. No hepatosplenomegaly. No bruits or masses.  Extremities: no cyanosis, clubbing, rash, edema Neuro: alert & oriented x3, cranial nerves grossly intact. Moves all 4 extremities w/o difficulty. Affect pleasant.   ECG: not done   ASSESSMENT & PLAN:  1: NICM with now preserved ejection fraction- - HF likely due to HTN - NYHA class I - euvolemic today - weighing daily; reminded to call for an overnight weight gain of > 2 pounds or a weekly weight gain of > 5 pounds - weight up 1 pound from last visit here 6 weeks ago - Echo 05/15/21: EF of 45-50% along with moderate LVH and severe LAE.  - Echo 09/06/22: EF 60-65% with mild LVH, Grade I DD, moderate LAE and mild John; reviewed echo results w/ him - not adding salt and has been reading food labels for sodium content; reviewed the importance of keeping daily sodium to < 2000mg  / day - keeping his daily fluid intake to between 60-64 ounces - continue entresto 49/51mg  BID - continue metoprolol 100mg  daily - continue spironolactone 25mg  daily - continue farxiga 10mg  daily  - BNP 05/14/21 was 286.7  2: HTN- - BP 131/84 - saw PCP Suzie Portela) 04/23 - BMP 09/03/22 reviewed and showed sodium 138, potassium 4.0, creatinine 1.26 and GFR >60  3: Obesity- - discussed the use of wegovy and that it may be beneficial to him if we can get it approved; he is agreeable to try it once he returns from his cruise if his  insurance will cover it - reviewed the importance of trying to eat something for lunch to limit the hunger when he gets home; he says that he likes pb & j so that would be easy to pack  Return in 4 months, sooner if needed. If wegovy is approved, will have him return 1 month after starting it.

## 2022-10-07 ENCOUNTER — Other Ambulatory Visit: Payer: Self-pay | Admitting: Family

## 2022-10-29 ENCOUNTER — Other Ambulatory Visit: Payer: Self-pay | Admitting: Family

## 2022-11-26 ENCOUNTER — Other Ambulatory Visit: Payer: Self-pay | Admitting: Family

## 2023-01-14 ENCOUNTER — Other Ambulatory Visit (HOSPITAL_COMMUNITY): Payer: Self-pay

## 2023-01-14 ENCOUNTER — Ambulatory Visit: Payer: No Typology Code available for payment source | Attending: Family | Admitting: Family

## 2023-01-14 ENCOUNTER — Encounter: Payer: Self-pay | Admitting: Family

## 2023-01-14 VITALS — BP 139/95 | HR 65 | Wt 354.0 lb

## 2023-01-14 DIAGNOSIS — I5032 Chronic diastolic (congestive) heart failure: Secondary | ICD-10-CM | POA: Diagnosis not present

## 2023-01-14 DIAGNOSIS — I428 Other cardiomyopathies: Secondary | ICD-10-CM | POA: Insufficient documentation

## 2023-01-14 DIAGNOSIS — Z7984 Long term (current) use of oral hypoglycemic drugs: Secondary | ICD-10-CM | POA: Diagnosis not present

## 2023-01-14 DIAGNOSIS — E669 Obesity, unspecified: Secondary | ICD-10-CM | POA: Diagnosis present

## 2023-01-14 DIAGNOSIS — I110 Hypertensive heart disease with heart failure: Secondary | ICD-10-CM | POA: Diagnosis not present

## 2023-01-14 DIAGNOSIS — I10 Essential (primary) hypertension: Secondary | ICD-10-CM

## 2023-01-14 DIAGNOSIS — Z79899 Other long term (current) drug therapy: Secondary | ICD-10-CM | POA: Insufficient documentation

## 2023-01-14 DIAGNOSIS — E785 Hyperlipidemia, unspecified: Secondary | ICD-10-CM | POA: Diagnosis present

## 2023-01-14 NOTE — Patient Instructions (Addendum)
Please call your primary care office and get a follow-up appointment scheduled. [phone]  Increase your fluid pill back to 1 whole tablet daily.

## 2023-01-14 NOTE — Progress Notes (Signed)
PCP: Merita Norton, NP (last seen 04/23) Primary Cardiologist: none  HPI:  Mr Koopmann is a 42 y/o male with a history of hyperlipidemia, HTN, obesity and chronic heart failure.   Has not been admitted or been in the ED in the last 6 months.   Echo 05/15/21: EF of 45-50% along with moderate LVH and severe LAE.  Echo 09/06/22: EF 60-65% with mild LVH, Grade I DD, moderate LAE and mild MR.   He presents today with a chief complaint of a follow-up visit. Currently denies fatigue, cough, shortness of breath, chest pain, palpitations, pedal edema, abdominal distention, dizziness or difficulty sleeping. Went on on a cruise to [place] back in August and knows that he overate. Overall he reports feeling well.   ROS: All systems negative except as listed in HPI, PMH and Problem List.  SH:  Social History   Socioeconomic History   Marital status: Married    Spouse name: Not on file   Number of children: Not on file   Years of education: Not on file   Highest education level: Not on file  Occupational History   Not on file  Tobacco Use   Smoking status: Never   Smokeless tobacco: Never  Vaping Use   Vaping status: Never Used  Substance and Sexual Activity   Alcohol use: No   Drug use: No   Sexual activity: Not Currently  Other Topics Concern   Not on file  Social History Narrative   Not on file   Social Determinants of Health   Financial Resource Strain: Not on file  Food Insecurity: Not on file  Transportation Needs: Not on file  Physical Activity: Not on file  Stress: Not on file (01/03/2023)  Social Connections: Not on file  Intimate Partner Violence: Not on file    FH:  Family History  Problem Relation Age of Onset   Hypertension Mother    Hypertension Father    Heart disease Father    Kidney disease Father    Hypertension Sister    Hypertension Brother    Cancer Maternal Grandmother        breast   Cancer Maternal Grandfather        bone cancer   Hypertension  Paternal Grandmother     Past Medical History:  Diagnosis Date   Arthritis    CHF (congestive heart failure) (HCC)    Hyperlipidemia    Hypertension    Obesity, Class III, BMI 40-49.9 (morbid obesity) (HCC)     Current Outpatient Medications  Medication Sig Dispense Refill   aspirin 81 MG tablet Take 81 mg by mouth daily.     atorvastatin (LIPITOR) 40 MG tablet Take 1 tablet (40 mg total) by mouth at bedtime. 30 tablet 5   FARXIGA 10 MG TABS tablet TAKE 1 TABLET BY MOUTH ONCE DAILY BEFORE BREAKFAST 30 tablet 5   furosemide (LASIX) 40 MG tablet TAKE 1/2 (ONE-HALF) TABLET BY MOUTH ONCE DAILY -  TAKE  AN  ADDITIONAL  1/2  TAB  IF  NEEDED  FOR  WEIGHT  GAIN  OR  SWELLING 30 tablet 5   isosorbide-hydrALAZINE (BIDIL) 20-37.5 MG tablet TAKE 1 TABLET BY MOUTH THREE TIMES DAILY 90 tablet 0   metoprolol succinate (TOPROL-XL) 100 MG 24 hr tablet Take 1 tablet (100 mg total) by mouth daily. Take with or immediately following a meal. 90 tablet 3   sacubitril-valsartan (ENTRESTO) 49-51 MG Take 1 tablet by mouth twice daily 60 tablet 5  Semaglutide-Weight Management (WEGOVY) 0.25 MG/0.5ML SOAJ Inject 0.25 mg into the skin once a week. 2 mL 5   spironolactone (ALDACTONE) 25 MG tablet Take 1 tablet by mouth once daily 30 tablet 5   No current facility-administered medications for this visit.   Vitals:   01/14/23 1459  BP: (!) 139/95  Pulse: 65  SpO2: 100%  Weight: (!) 354 lb (160.6 kg)   Wt Readings from Last 3 Encounters:  01/14/23 (!) 354 lb (160.6 kg)  09/13/22 (!) 349 lb 12.8 oz (158.7 kg)  08/02/22 (!) 348 lb 6.4 oz (158 kg)   Lab Results  Component Value Date   CREATININE 1.26 (H) 09/03/2022   CREATININE 1.12 03/28/2022   CREATININE 1.25 (H) 03/07/2022   PHYSICAL EXAM:  General:  Well appearing. No resp difficulty HEENT: normal Neck: supple. JVP flat. No lymphadenopathy or thryomegaly appreciated. Cor: PMI normal. Regular rate & rhythm. No rubs, gallops or murmurs. Lungs:  clear Abdomen: soft, nontender, nondistended. No hepatosplenomegaly. No bruits or masses.  Extremities: no cyanosis, clubbing, rash, edema Neuro: alert & oriented x3, cranial nerves grossly intact. Moves all 4 extremities w/o difficulty. Affect pleasant.   ECG: not done   ASSESSMENT & PLAN:  1: NICM with preserved ejection fraction- - HF likely due to HTN - NYHA class I - euvolemic today - weighing daily; reminded to call for an overnight weight gain of > 2 pounds or a weekly weight gain of > 5 pounds - weight up 5 pounds from last visit here 4 months ago - Echo 05/15/21: EF of 45-50% along with moderate LVH and severe LAE.  - Echo 09/06/22: EF 60-65% with mild LVH, Grade I DD, moderate LAE and mild MR; reviewed echo results w/ him - not adding salt and has been reading food labels for sodium content; reviewed the importance of keeping daily sodium to < 2000mg  / day - keeping his daily fluid intake to between 60-64 ounces - continue entresto 49/51mg  BID - continue metoprolol 100mg  daily - continue spironolactone 25mg  daily - continue farxiga 10mg  daily - increase furosemide back to 40mg  daily (was taking 20mg ) - BMET next visit - discussed titrating up his entresto due to elevated BP but he asks to wait till next time as he wants to work on his diet/ exercise - BNP 05/14/21 was 286.7  2: HTN- - BP 139/95 - increasing furosemide back to 40mg  daily - saw PCP Suzie Portela) 04/23; her OV number placed on his AVS and emphasized that he call to get f/u appt scheduled - BMP 09/03/22 reviewed and showed sodium 138, potassium 4.0, creatinine 1.26 and GFR >60  3: Obesity- - no GLP's will be approved with his insurance unless his AIC>6.5% - A1c 06/08/21 was 5.8% and he declines getting it re-tested today  Return in 1 month, sooner if needed.

## 2023-01-20 ENCOUNTER — Other Ambulatory Visit: Payer: Self-pay | Admitting: Family

## 2023-02-13 NOTE — Progress Notes (Deleted)
PCP: Merita Norton, NP (last seen 04/23) Primary Cardiologist: none  HPI:  John Lowe is a 42 y/o male with a history of hyperlipidemia, HTN, obesity and chronic heart failure.   Has not been admitted or been in the ED in the last 6 months.   Echo 05/15/21: EF of 45-50% along with moderate LVH and severe LAE.  Echo 09/06/22: EF 60-65% with mild LVH, Grade I DD, moderate LAE and mild John.   He presents today with a chief complaint of a follow-up visit.   At last visit, furosemide was increased to 40mg  daily.   ROS: All systems negative except as listed in HPI, PMH and Problem List.  SH:  Social History   Socioeconomic History   Marital status: Married    Spouse name: Not on file   Number of children: Not on file   Years of education: Not on file   Highest education level: Not on file  Occupational History   Not on file  Tobacco Use   Smoking status: Never   Smokeless tobacco: Never  Vaping Use   Vaping status: Never Used  Substance and Sexual Activity   Alcohol use: No   Drug use: No   Sexual activity: Not Currently  Other Topics Concern   Not on file  Social History Narrative   Not on file   Social Drivers of Health   Financial Resource Strain: Not on file  Food Insecurity: Not on file  Transportation Needs: Not on file  Physical Activity: Not on file  Stress: Not on file (01/03/2023)  Social Connections: Not on file  Intimate Partner Violence: Not on file    FH:  Family History  Problem Relation Age of Onset   Hypertension Mother    Hypertension Father    Heart disease Father    Kidney disease Father    Hypertension Sister    Hypertension Brother    Cancer Maternal Grandmother        breast   Cancer Maternal Grandfather        bone cancer   Hypertension Paternal Grandmother     Past Medical History:  Diagnosis Date   Arthritis    CHF (congestive heart failure) (HCC)    Hyperlipidemia    Hypertension    Obesity, Class III, BMI 40-49.9 (morbid  obesity) (HCC)     Current Outpatient Medications  Medication Sig Dispense Refill   aspirin 81 MG tablet Take 81 mg by mouth daily.     atorvastatin (LIPITOR) 40 MG tablet Take 1 tablet (40 mg total) by mouth at bedtime. 30 tablet 5   FARXIGA 10 MG TABS tablet TAKE 1 TABLET BY MOUTH ONCE DAILY BEFORE BREAKFAST 30 tablet 5   furosemide (LASIX) 40 MG tablet TAKE 1/2 (ONE-HALF) TABLET BY MOUTH ONCE DAILY -  TAKE  AN  ADDITIONAL  1/2  TAB  IF  NEEDED  FOR  WEIGHT  GAIN  OR  SWELLING (Patient taking differently: Take 40 mg by mouth daily.) 30 tablet 5   isosorbide-hydrALAZINE (BIDIL) 20-37.5 MG tablet TAKE 1 TABLET BY MOUTH THREE TIMES DAILY 90 tablet 0   metoprolol succinate (TOPROL-XL) 100 MG 24 hr tablet Take 1 tablet (100 mg total) by mouth daily. Take with or immediately following a meal. 90 tablet 3   sacubitril-valsartan (ENTRESTO) 49-51 MG Take 1 tablet by mouth twice daily 60 tablet 5   spironolactone (ALDACTONE) 25 MG tablet Take 1 tablet by mouth once daily 30 tablet 5   No  current facility-administered medications for this visit.     PHYSICAL EXAM:  General:  Well appearing. No resp difficulty HEENT: normal Neck: supple. JVP flat. No lymphadenopathy or thryomegaly appreciated. Cor: PMI normal. Regular rate & rhythm. No rubs, gallops or murmurs. Lungs: clear Abdomen: soft, nontender, nondistended. No hepatosplenomegaly. No bruits or masses.  Extremities: no cyanosis, clubbing, rash, edema Neuro: alert & oriented x3, cranial nerves grossly intact. Moves all 4 extremities w/o difficulty. Affect pleasant.   ECG: not done   ASSESSMENT & PLAN:  1: NICM with preserved ejection fraction- - HF likely due to HTN - NYHA class I - euvolemic today - weighing daily; reminded to call for an overnight weight gain of > 2 pounds or a weekly weight gain of > 5 pounds - weight 354 pounds from last visit here 1 month ago - Echo 05/15/21: EF of 45-50% along with moderate LVH and severe LAE.   - Echo 09/06/22: EF 60-65% with mild LVH, Grade I DD, moderate LAE and mild John; reviewed echo results w/ him - not adding salt and has been reading food labels for sodium content; reviewed the importance of keeping daily sodium to < 2000mg  / day - keeping his daily fluid intake to between 60-64 ounces - continue entresto 49/51mg  BID - continue metoprolol 100mg  daily - continue spironolactone 25mg  daily - continue farxiga 10mg  daily - continue furosemide 40mg  daily  - BMET today - discussed titrating up his entresto due to elevated BP but he asks to wait till next time as he wants to work on his diet/ exercise - BNP 05/14/21 was 286.7  2: HTN- - BP  - saw PCP Suzie Portela) 04/23; her OV number placed on his AVS and emphasized that he call to get f/u appt scheduled - BMP 09/03/22 reviewed and showed sodium 138, potassium 4.0, creatinine 1.26 and GFR >60  3: Obesity- - no GLP's will be approved with his insurance unless his AIC>6.5% - A1c 06/08/21 was 5.8% and he declines getting it re-tested today

## 2023-02-14 ENCOUNTER — Encounter: Payer: No Typology Code available for payment source | Admitting: Family

## 2023-02-21 ENCOUNTER — Other Ambulatory Visit: Payer: Self-pay | Admitting: Family

## 2023-02-25 ENCOUNTER — Other Ambulatory Visit: Payer: Self-pay | Admitting: Family

## 2023-02-28 ENCOUNTER — Encounter: Payer: Self-pay | Admitting: Family

## 2023-02-28 ENCOUNTER — Ambulatory Visit: Payer: No Typology Code available for payment source | Attending: Family | Admitting: Family

## 2023-02-28 VITALS — BP 136/94 | HR 86 | Wt 354.0 lb

## 2023-02-28 DIAGNOSIS — I110 Hypertensive heart disease with heart failure: Secondary | ICD-10-CM | POA: Insufficient documentation

## 2023-02-28 DIAGNOSIS — I428 Other cardiomyopathies: Secondary | ICD-10-CM | POA: Insufficient documentation

## 2023-02-28 DIAGNOSIS — Z79899 Other long term (current) drug therapy: Secondary | ICD-10-CM | POA: Insufficient documentation

## 2023-02-28 DIAGNOSIS — E66813 Obesity, class 3: Secondary | ICD-10-CM | POA: Insufficient documentation

## 2023-02-28 DIAGNOSIS — I5032 Chronic diastolic (congestive) heart failure: Secondary | ICD-10-CM

## 2023-02-28 DIAGNOSIS — E785 Hyperlipidemia, unspecified: Secondary | ICD-10-CM | POA: Insufficient documentation

## 2023-02-28 DIAGNOSIS — Z6841 Body Mass Index (BMI) 40.0 and over, adult: Secondary | ICD-10-CM | POA: Insufficient documentation

## 2023-02-28 DIAGNOSIS — I10 Essential (primary) hypertension: Secondary | ICD-10-CM | POA: Diagnosis not present

## 2023-02-28 DIAGNOSIS — Z7984 Long term (current) use of oral hypoglycemic drugs: Secondary | ICD-10-CM | POA: Diagnosis not present

## 2023-02-28 NOTE — Progress Notes (Signed)
 PCP: Emilio Marseille, NP (last seen 04/23) Primary Cardiologist: none  Chief Complaint: head/ sinus congestion   HPI:  Mr Herbster is a 43 y/o male with a history of hyperlipidemia, HTN, obesity and chronic heart failure.   Has not been admitted or been in the ED in the last 6 months.   Echo 05/15/21: EF of 45-50% along with moderate LVH and severe LAE.  Echo 09/06/22: EF 60-65% with mild LVH, Grade I DD, moderate LAE and mild MR.   He presents today with a chief complaint head/ sinus congestion. Says that this has worsened with the back and forth weather changes. Occasionally has trouble sleeping due to the congestion. Denies fatigue, shortness of breath, chest pain, cough, palpitations, abdominal distention, pedal edema, dizziness or weight gain.   At last visit, furosemide  was increased to 40mg  daily & he denies any issues with this dose change that he's aware of.   At home, he says that his SBP 120-130's/ DBP 80-90's. He will follow-up with PCP if head congestion doesn't resolve.  ROS: All systems negative except as listed in HPI, PMH and Problem List.  SH:  Social History   Socioeconomic History   Marital status: Married    Spouse name: Not on file   Number of children: Not on file   Years of education: Not on file   Highest education level: Not on file  Occupational History   Not on file  Tobacco Use   Smoking status: Never   Smokeless tobacco: Never  Vaping Use   Vaping status: Never Used  Substance and Sexual Activity   Alcohol use: No   Drug use: No   Sexual activity: Not Currently  Other Topics Concern   Not on file  Social History Narrative   Not on file   Social Drivers of Health   Financial Resource Strain: Not on file  Food Insecurity: Not on file  Transportation Needs: Not on file  Physical Activity: Not on file  Stress: Not on file (01/03/2023)  Social Connections: Not on file  Intimate Partner Violence: Not on file    FH:  Family History  Problem  Relation Age of Onset   Hypertension Mother    Hypertension Father    Heart disease Father    Kidney disease Father    Hypertension Sister    Hypertension Brother    Cancer Maternal Grandmother        breast   Cancer Maternal Grandfather        bone cancer   Hypertension Paternal Grandmother     Past Medical History:  Diagnosis Date   Arthritis    CHF (congestive heart failure) (HCC)    Hyperlipidemia    Hypertension    Obesity, Class III, BMI 40-49.9 (morbid obesity) (HCC)     Current Outpatient Medications  Medication Sig Dispense Refill   aspirin  81 MG tablet Take 81 mg by mouth daily.     atorvastatin  (LIPITOR) 40 MG tablet Take 1 tablet (40 mg total) by mouth at bedtime. 30 tablet 5   FARXIGA  10 MG TABS tablet TAKE 1 TABLET BY MOUTH ONCE DAILY BEFORE BREAKFAST 30 tablet 5   furosemide  (LASIX ) 40 MG tablet TAKE 1/2 (ONE-HALF) TABLET BY MOUTH ONCE DAILY -  TAKE  AN  ADDITIONAL  1/2  TAB  IF  NEEDED  FOR  WEIGHT  GAIN  OR  SWELLING (Patient taking differently: Take 40 mg by mouth daily.) 30 tablet 5   isosorbide -hydrALAZINE  (BIDIL) 20-37.5 MG  tablet TAKE 1 TABLET BY MOUTH THREE TIMES DAILY 90 tablet 0   metoprolol  succinate (TOPROL -XL) 100 MG 24 hr tablet Take 1 tablet (100 mg total) by mouth daily. Take with or immediately following a meal. 90 tablet 3   sacubitril -valsartan  (ENTRESTO ) 49-51 MG Take 1 tablet by mouth twice daily 60 tablet 5   spironolactone  (ALDACTONE ) 25 MG tablet Take 1 tablet by mouth once daily 30 tablet 5   No current facility-administered medications for this visit.   Vitals:   02/28/23 1419  BP: (!) 136/94  Pulse: 86  SpO2: 99%  Weight: (!) 354 lb (160.6 kg)   Wt Readings from Last 3 Encounters:  02/28/23 (!) 354 lb (160.6 kg)  01/14/23 (!) 354 lb (160.6 kg)  09/13/22 (!) 349 lb 12.8 oz (158.7 kg)   Lab Results  Component Value Date   CREATININE 1.26 (H) 09/03/2022   CREATININE 1.12 03/28/2022   CREATININE 1.25 (H) 03/07/2022   PHYSICAL  EXAM:  General:  Well appearing. No resp difficulty HEENT: eyelids are puffy, nasal rhinorrhea Neck: supple. JVP flat. No lymphadenopathy or thryomegaly appreciated. Cor: PMI normal. Regular rate & rhythm. No rubs, gallops or murmurs. Lungs: clear Abdomen: soft, nontender, nondistended. No hepatosplenomegaly. No bruits or masses.  Extremities: no cyanosis, clubbing, rash, edema Neuro: alert & oriented x3, cranial nerves grossly intact. Moves all 4 extremities w/o difficulty. Affect pleasant.   ECG: not done   ASSESSMENT & PLAN:  1: NICM with preserved ejection fraction- - HF likely due to HTN - NYHA class I - euvolemic today - weighing daily; reminded to call for an overnight weight gain of > 2 pounds or a weekly weight gain of > 5 pounds - weight unchanged from last visit here 6 weeks ago - Echo 05/15/21: EF of 45-50% along with moderate LVH and severe LAE.  - Echo 09/06/22: EF 60-65% with mild LVH, Grade I DD, moderate LAE and mild MR; reviewed echo results w/ him - not adding salt and has been reading food labels for sodium content; reviewed the importance of keeping daily sodium to < 2000mg  / day - keeping his daily fluid intake to between 60-64 ounces - continue entresto  49/51mg  BID - continue metoprolol  100mg  daily - continue spironolactone  25mg  daily - continue farxiga  10mg  daily - continue furosemide  40mg  daily  - BNP 05/14/21 was 286.7  2: HTN- - BP 136/94 - saw PCP Froylan) 04/23; returns 01/25 - BMP 09/03/22 reviewed and showed sodium 138, potassium 4.0, creatinine 1.26 and GFR >60 - BMET today  3: Obesity- - no GLP's will be approved with his insurance unless his AIC>6.5% - A1c 06/08/21 was 5.8% and he declines getting it re-tested  - lipid panel today  Return in 4 months, sooner if needed.

## 2023-02-28 NOTE — Patient Instructions (Signed)
 Go DOWN to LOWER LEVEL (LL) to have your blood work completed inside of [employer] office.  We will only call you if the results are abnormal or if the provider would like to make medication changes.

## 2023-03-01 LAB — LIPID PANEL
Chol/HDL Ratio: 3.3 {ratio} (ref 0.0–5.0)
Cholesterol, Total: 133 mg/dL (ref 100–199)
HDL: 40 mg/dL (ref >39)
LDL Chol Calc (NIH): 79 mg/dL (ref 0–99)
Triglycerides: 66 mg/dL (ref 0–149)
VLDL Cholesterol Cal: 14 mg/dL (ref 5–40)

## 2023-03-01 LAB — BASIC METABOLIC PANEL
BUN/Creatinine Ratio: 13 (ref 9–20)
BUN: 14 mg/dL (ref 6–24)
CO2: 23 mmol/L (ref 20–29)
Calcium: 9.6 mg/dL (ref 8.7–10.2)
Chloride: 101 mmol/L (ref 96–106)
Creatinine, Ser: 1.1 mg/dL (ref 0.76–1.27)
Glucose: 87 mg/dL (ref 70–99)
Potassium: 4 mmol/L (ref 3.5–5.2)
Sodium: 140 mmol/L (ref 134–144)
eGFR: 85 mL/min/{1.73_m2} (ref >59)

## 2023-03-03 ENCOUNTER — Other Ambulatory Visit: Payer: Self-pay | Admitting: Family

## 2023-03-06 ENCOUNTER — Other Ambulatory Visit: Payer: Self-pay | Admitting: Family

## 2023-03-26 ENCOUNTER — Encounter: Payer: Self-pay | Admitting: Family Medicine

## 2023-03-31 ENCOUNTER — Other Ambulatory Visit: Payer: Self-pay | Admitting: Family

## 2023-04-17 ENCOUNTER — Encounter: Payer: Self-pay | Admitting: Family Medicine

## 2023-05-14 ENCOUNTER — Other Ambulatory Visit: Payer: Self-pay | Admitting: Family

## 2023-06-18 ENCOUNTER — Other Ambulatory Visit: Payer: Self-pay | Admitting: Family

## 2023-06-25 ENCOUNTER — Other Ambulatory Visit: Payer: Self-pay | Admitting: Family

## 2023-07-04 ENCOUNTER — Encounter: Payer: No Typology Code available for payment source | Admitting: Family

## 2023-07-17 ENCOUNTER — Telehealth: Payer: Self-pay | Admitting: Family

## 2023-07-17 NOTE — Telephone Encounter (Signed)
 Called to confirm/remind patient of their appointment at the [facility] on 07/18/23.   Appointment:   [x] Confirmed  [] Left mess   [] No answer/No voice mail  [] VM Full/unable to leave message  [] Phone not in service  Patient reminded to bring all medications and/or complete list.  Confirmed patient has transportation. Gave directions, instructed to utilize valet parking.

## 2023-07-17 NOTE — Progress Notes (Signed)
 [facility] Note    PCP: Iona Manis, NP (last seen 04/23) Primary Cardiologist: [facility] 03/23  Chief Complaint: heart failure   HPI:  John Lowe is a 43 y/o male with a history of hyperlipidemia, HTN, obesity and chronic heart failure (onset 03/23)   Admitted 05/14/21 with 3 weeks of worsening SOB, orthopnea and wheezing. Echo 05/15/21: EF of 45-50% along with moderate LVH and severe LAE. Had not been taking his meds for previous month due to finances. BP significantly elevated. BP meds started.    Echo 09/06/22: EF 60-65% with mild LVH, Grade I DD, moderate LAE and mild John.   He presents today with a chief complaint of a follow-up visit. Currently denies shortness of breath, fatigue, chest pain, palpitations, abdominal distention, pedal edema, dizziness, weight gain or difficulty sleeping. Has all of his medications. Has been trying to eat more healthy.   ROS: All systems negative except as listed in HPI, PMH and Problem List.  SH:  Social History   Socioeconomic History   Marital status: Married    Spouse name: Not on file   Number of children: Not on file   Years of education: Not on file   Highest education level: Not on file  Occupational History   Not on file  Tobacco Use   Smoking status: Never   Smokeless tobacco: Never  Vaping Use   Vaping status: Never Used  Substance and Sexual Activity   Alcohol use: No   Drug use: No   Sexual activity: Not Currently  Other Topics Concern   Not on file  Social History Narrative   Not on file   Social Drivers of Health   Financial Resource Strain: Not on file  Food Insecurity: Not on file  Transportation Needs: Not on file  Physical Activity: Not on file  Stress: Not on file (01/03/2023)  Social Connections: Not on file  Intimate Partner Violence: Not on file    FH:  Family History  Problem Relation Age of Onset   Hypertension Mother    Hypertension Father    Heart disease Father    Kidney  disease Father    Hypertension Sister    Hypertension Brother    Cancer Maternal Grandmother        breast   Cancer Maternal Grandfather        bone cancer   Hypertension Paternal Grandmother     Past Medical History:  Diagnosis Date   Arthritis    CHF (congestive heart failure) (HCC)    Hyperlipidemia    Hypertension    Obesity, Class III, BMI 40-49.9 (morbid obesity) (HCC)     Current Outpatient Medications  Medication Sig Dispense Refill   aspirin  81 MG tablet Take 81 mg by mouth daily.     atorvastatin  (LIPITOR) 40 MG tablet TAKE 1 TABLET BY MOUTH AT BEDTIME 90 tablet 3   FARXIGA  10 MG TABS tablet TAKE 1 TABLET BY MOUTH ONCE DAILY BEFORE BREAKFAST 30 tablet 5   furosemide  (LASIX ) 40 MG tablet Take 1 tablet (40 mg total) by mouth daily. 30 tablet 5   isosorbide -hydrALAZINE  (BIDIL) 20-37.5 MG tablet TAKE 1 TABLET BY MOUTH THREE TIMES DAILY 90 tablet 11   metoprolol  succinate (TOPROL -XL) 100 MG 24 hr tablet TAKE 1 TABLET BY MOUTH ONCE DAILY TAKE  WITH  OR  IMMEDIATELY  FOLLOWING  A  MEAL 30 tablet 11   sacubitril -valsartan  (ENTRESTO ) 49-51 MG Take 1 tablet by mouth twice daily 60 tablet 5  spironolactone  (ALDACTONE ) 25 MG tablet Take 1 tablet by mouth once daily 90 tablet 3   No current facility-administered medications for this visit.   Vitals:   07/18/23 1425  BP: 134/77  Pulse: (!) 58  SpO2: 97%  Weight: (!) 347 lb (157.4 kg)   Wt Readings from Last 3 Encounters:  07/18/23 (!) 347 lb (157.4 kg)  02/28/23 (!) 354 lb (160.6 kg)  01/14/23 (!) 354 lb (160.6 kg)   Lab Results  Component Value Date   CREATININE 1.10 02/28/2023   CREATININE 1.26 (H) 09/03/2022   CREATININE 1.12 03/28/2022    PHYSICAL EXAM:  General: Well appearing. No resp difficulty HEENT: normal Neck: supple, no JVD Cor: Regular rhythm, bradycardic. No rubs, gallops or murmurs Lungs: clear Abdomen: soft, nontender, nondistended. Extremities: no cyanosis, clubbing, rash, edema Neuro: alert &  oriented X 3. Moves all 4 extremities w/o difficulty. Affect pleasant   ECG: not done   ASSESSMENT & PLAN:  1: NICM with preserved ejection fraction- - HF likely due to HTN - NYHA class I - euvolemic today - weighing daily; reminded to call for an overnight weight gain of > 2 pounds or a weekly weight gain of > 5 pounds - weight down 7 pounds from last visit here 4 months ago - Echo 05/15/21: EF of 45-50% along with moderate LVH and severe LAE.  - Echo 09/06/22: EF 60-65% with mild LVH, Grade I DD, moderate LAE and mild John; reviewed echo results w/ him - not adding salt and has been reading food labels for sodium content; reviewed the importance of keeping daily sodium to < 2000mg  / day - keeping his daily fluid intake to between 60-64 ounces - his brother has been diagnosed with HF; will get genetic testing done today - continue farxiga  10mg  daily - continue furosemide  40mg  daily  - continue bidil 1 tablet TID - decrease metoprolol  to 50mg  daily as no data in HFpEF - continue entresto  49/51mg  BID; could titrate up if needed for BP control - continue spironolactone  25mg  daily - [facility] cardiology referral placed today as they haven't seen him since 2023 - BNP 05/14/21 was 286.7  2: HTN- - BP 134/77 - saw PCP Marlys Singh) 04/23; returns 06/25 - BMP 02/28/23 reviewed: sodium 140, potassium 4.0, creatinine 1.1 and GFR 85 - will get BMET updated but he can't stay today; will return in next 1-2 weeks for labs  3: Obesity- - no GLP's will be approved with his insurance unless his AIC>6.5% - A1c 06/08/21 was 5.8% and he declines getting it re-tested   4: Hyperlipidemia- - LDL 02/28/23 was 79 - continue atorvastatin  40mg  daily   Return in 4 months, sooner if needed.   Charlette Console, FNP 07/17/23

## 2023-07-18 ENCOUNTER — Ambulatory Visit: Attending: Family | Admitting: Family

## 2023-07-18 ENCOUNTER — Encounter: Payer: Self-pay | Admitting: Family

## 2023-07-18 VITALS — BP 134/77 | HR 58 | Wt 347.0 lb

## 2023-07-18 DIAGNOSIS — I428 Other cardiomyopathies: Secondary | ICD-10-CM | POA: Diagnosis not present

## 2023-07-18 DIAGNOSIS — E782 Mixed hyperlipidemia: Secondary | ICD-10-CM

## 2023-07-18 DIAGNOSIS — I110 Hypertensive heart disease with heart failure: Secondary | ICD-10-CM | POA: Insufficient documentation

## 2023-07-18 DIAGNOSIS — E669 Obesity, unspecified: Secondary | ICD-10-CM | POA: Insufficient documentation

## 2023-07-18 DIAGNOSIS — Z79899 Other long term (current) drug therapy: Secondary | ICD-10-CM | POA: Diagnosis not present

## 2023-07-18 DIAGNOSIS — I5032 Chronic diastolic (congestive) heart failure: Secondary | ICD-10-CM | POA: Insufficient documentation

## 2023-07-18 DIAGNOSIS — E785 Hyperlipidemia, unspecified: Secondary | ICD-10-CM | POA: Diagnosis not present

## 2023-07-18 DIAGNOSIS — I10 Essential (primary) hypertension: Secondary | ICD-10-CM

## 2023-07-18 DIAGNOSIS — Z7984 Long term (current) use of oral hypoglycemic drugs: Secondary | ICD-10-CM | POA: Diagnosis not present

## 2023-07-18 MED ORDER — METOPROLOL SUCCINATE ER 100 MG PO TB24: 50.0000 mg | ORAL_TABLET | Freq: Every day | ORAL | 3 refills | Status: DC

## 2023-07-18 NOTE — Progress Notes (Signed)
 TTR genetic testing collected via [facility] per Dr Shawnee Dellen, FNP.

## 2023-07-18 NOTE — Patient Instructions (Addendum)
 Medication Changes:  DECREASE YOUR METOPROLOL  TO 1/2 TABLET ONCE DAILY   Lab Work:  Go over to the MEDICAL MALL. Go pass the gift shop and have your blood work completed.  We will only call you if the results are abnormal or if the provider would like to make medication changes.   Testing/Procedures:  Genetic testing has been collected, this has to be sent to [place]  for processing and can take 1-2 weeks for us  to get results back.  We will let you know the results once reviewed by your provider.   We have placed a referral today to General Cardiology downstairs pm [place]. They should reach out to you within 1-2 weeks. If they do not, please reach out to them. Their information is below on this AVS.   Follow-Up in: 4 MONTHS WITH TINA HACKNEY, FNP.  At the [facility], you and your health needs are our priority. We have a designated team specialized in the treatment of Heart Failure. This Care Team includes your primary Heart Failure Specialized Cardiologist (physician), Advanced Practice Providers (APPs- Physician Assistants and Nurse Practitioners), and Pharmacist who all work together to provide you with the care you need, when you need it.   You may see any of the following providers on your designated Care Team at your next follow up:  Dr. Jules Oar Dr. Peder Bourdon Dr. Alwin Baars Dr. Judyth Nunnery Shawnee Dellen, FNP Bevely Brush, RPH-CPP  Please be sure to bring in all your medications bottles to every appointment.   Need to Contact Us :  If you have any questions or concerns before your next appointment please send us  a message through [place] or call our office at [phone].    TO LEAVE A MESSAGE FOR THE NURSE SELECT OPTION 2, PLEASE LEAVE A MESSAGE INCLUDING: YOUR NAME DATE OF BIRTH CALL BACK NUMBER REASON FOR CALL**this is important as we prioritize the call backs  YOU WILL RECEIVE A CALL BACK THE SAME DAY AS LONG AS YOU CALL  BEFORE 4:00 PM

## 2023-08-12 ENCOUNTER — Ambulatory Visit (INDEPENDENT_AMBULATORY_CARE_PROVIDER_SITE_OTHER): Admitting: Family Medicine

## 2023-08-12 ENCOUNTER — Encounter: Payer: Self-pay | Admitting: Family Medicine

## 2023-08-12 VITALS — BP 113/80 | HR 74 | Resp 16 | Ht 75.0 in | Wt 341.3 lb

## 2023-08-12 DIAGNOSIS — Z0001 Encounter for general adult medical examination with abnormal findings: Secondary | ICD-10-CM | POA: Diagnosis not present

## 2023-08-12 DIAGNOSIS — I5032 Chronic diastolic (congestive) heart failure: Secondary | ICD-10-CM

## 2023-08-12 DIAGNOSIS — I10 Essential (primary) hypertension: Secondary | ICD-10-CM

## 2023-08-12 DIAGNOSIS — Z0000 Encounter for general adult medical examination without abnormal findings: Secondary | ICD-10-CM

## 2023-08-12 DIAGNOSIS — J301 Allergic rhinitis due to pollen: Secondary | ICD-10-CM

## 2023-08-12 DIAGNOSIS — Z130 Encounter for screening for diseases of the blood and blood-forming organs and certain disorders involving the immune mechanism: Secondary | ICD-10-CM

## 2023-08-12 MED ORDER — FLUTICASONE PROPIONATE 50 MCG/ACT NA SUSP: 1.0000 | Freq: Two times a day (BID) | NASAL | 6 refills | Status: AC

## 2023-08-12 NOTE — Progress Notes (Signed)
 Complete physical exam   Patient: John Lowe   DOB: January 03, 1981   43 y.o. Male  MRN: [id number] Visit Date: 08/12/2023  Today's healthcare provider: Carlean Charter, DO   Chief Complaint  Patient presents with   Annual Exam    Sleeping pattern good Exercising daily Decline vaccines   Subjective    John Lowe is a 43 y.o. male who presents today for a complete physical exam.  He reports consuming a general, low sodium, and low starch diet. 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 He generally feels well. He reports sleeping well. He does not have additional problems to discuss today.   HPI HPI     Annual Exam    Additional comments: Sleeping pattern good Exercising daily Decline vaccines      Last edited by Selinda Dales, CMA on 08/12/2023  3:08 PM.      John Lowe is a 43 year old male who presents for an annual physical exam.  He feels well overall with no specific concerns. He sleeps well but could use more rest. 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 He follows a diet low in salt and starches to manage his blood pressure and monitors it regularly.  He has not received the COVID booster for this season and is unsure about his last tetanus vaccine, estimating it was less than ten years ago.  He takes several medications including atorvastatin , Farxiga , furosemide , isosorbide , hydralazine , metoprolol , Entresto , spironolactone , and aspirin , and reports taking them consistently.  He experiences seasonal allergies, primarily in spring and summer, and uses Flonase  effectively to manage symptoms, taking one puff in the morning and one at night.    Past Medical History:  Diagnosis Date   Arthritis    CHF (congestive heart failure) (HCC)    Hyperlipidemia    Hypertension    Obesity, Class III, BMI 40-49.9 (morbid obesity)    Past Surgical History:  Procedure Laterality Date   ANKLE  SURGERY     rotator cuff Right    Social History   Socioeconomic History   Marital status: Married    Spouse name: Not on file   Number of children: Not on file   Years of education: Not on file   Highest education level: Not on file  Occupational History   Not on file  Tobacco Use   Smoking status: Never   Smokeless tobacco: Never  Vaping Use   Vaping status: Never Used  Substance and Sexual Activity   Alcohol use: No   Drug use: No   Sexual activity: Not Currently  Other Topics Concern   Not on file  Social History Narrative   Not on file   Social Drivers of Health   Financial Resource Strain: Low Risk  (08/12/2023)   Overall Financial Resource Strain (CARDIA)    Difficulty of Paying Living Expenses: Not hard at all  Food Insecurity: No Food Insecurity (08/12/2023)   Hunger Vital Sign    Worried About Running Out of Food in the Last Year: Never true    Ran Out of Food in the Last Year: Never true  Transportation Needs: No Transportation Needs (08/12/2023)   PRAPARE - Administrator, Civil Service (Medical): No    Lack of Transportation (Non-Medical): No  Physical Activity: Sufficiently Active (08/12/2023)   Exercise Vital Sign    Days of  Exercise per Week: 7 days    Minutes of Exercise per Session: 60 min  Stress: No Stress Concern Present (08/12/2023)   [employer] of Occupational Health - Occupational Stress Questionnaire    Feeling of Stress: Not at all  Social Connections: Socially Integrated (08/12/2023)   Social Connection and Isolation Panel    Frequency of Communication with Friends and Family: More than three times a week    Frequency of Social Gatherings with Friends and Family: Three times a week    Attends Religious Services: More than 4 times per year    Active Member of Clubs or Organizations: Yes    Attends Banker Meetings: More than 4 times per year    Marital Status: Married  Catering manager Violence: Not At Risk  (08/12/2023)   Humiliation, Afraid, Rape, and Kick questionnaire    Fear of Current or Ex-Partner: No    Emotionally Abused: No    Physically Abused: No    Sexually Abused: No   Family Status  Relation Name Status   Mother  Alive   Father  Deceased       heart and kidney failure   Sister  Alive   Brother  Alive   MGM  Deceased       cancer breast   MGF  Deceased       old age, bone cancer   PGM  Alive   PGF  Deceased       unknown  No partnership data on file   Family History  Problem Relation Age of Onset   Hypertension Mother    Hypertension Father    Heart disease Father    Kidney disease Father    Hypertension Sister    Hypertension Brother    Cancer Maternal Grandmother        breast   Cancer Maternal Grandfather        bone cancer   Hypertension Paternal Grandmother    No Known Allergies  Patient Care Team: Dalena Plantz, Asencion Blacksmith, DO as PCP - General (Family Medicine) Wenona Hamilton, MD as Consulting Physician (Cardiology)   Medications: Outpatient Medications Prior to Visit  Medication Sig   aspirin  81 MG tablet Take 81 mg by mouth daily.   atorvastatin  (LIPITOR) 40 MG tablet TAKE 1 TABLET BY MOUTH AT BEDTIME   FARXIGA  10 MG TABS tablet TAKE 1 TABLET BY MOUTH ONCE DAILY BEFORE BREAKFAST   furosemide  (LASIX ) 40 MG tablet Take 1 tablet (40 mg total) by mouth daily.   isosorbide -hydrALAZINE  (BIDIL) 20-37.5 MG tablet TAKE 1 TABLET BY MOUTH THREE TIMES DAILY   metoprolol  succinate (TOPROL -XL) 100 MG 24 hr tablet Take 0.5 tablets (50 mg total) by mouth daily. Take with or immediately following a meal.   sacubitril -valsartan  (ENTRESTO ) 49-51 MG Take 1 tablet by mouth twice daily   spironolactone  (ALDACTONE ) 25 MG tablet Take 1 tablet by mouth once daily   No facility-administered medications prior to visit.    Review of Systems  Constitutional:  Negative for appetite change, chills, fatigue and fever.  HENT:  Negative for congestion, ear pain, hearing loss,  nosebleeds and trouble swallowing.   Eyes:  Negative for pain and visual disturbance.  Respiratory:  Negative for cough, chest tightness and shortness of breath.   Cardiovascular:  Negative for chest pain, palpitations and leg swelling.  Gastrointestinal:  Negative for abdominal pain, blood in stool, constipation, diarrhea, nausea and vomiting.  Endocrine: Negative for polydipsia, polyphagia and polyuria.  Genitourinary:  Negative for dysuria and flank pain.  Musculoskeletal:  Negative for arthralgias, back pain, joint swelling, myalgias and neck stiffness.  Skin:  Negative for color change, rash and wound.  Neurological:  Negative for dizziness, tremors, seizures, speech difficulty, weakness, light-headedness and headaches.  Psychiatric/Behavioral:  Negative for behavioral problems, confusion, decreased concentration, dysphoric mood and sleep disturbance. The patient is not nervous/anxious.   All other systems reviewed and are negative.     Objective    BP 113/80 (BP Location: Right Arm, Patient Position: Sitting, Cuff Size: Large)   Pulse 74   Resp 16   Ht 6' 3 (1.905 m)   Wt (!) 341 lb 4.8 oz (154.8 kg)   SpO2 99%   BMI 42.66 kg/m    Physical Exam Vitals and nursing note reviewed.  Constitutional:      General: He is awake.     Appearance: Normal appearance. He is morbidly obese.  HENT:     Head: Normocephalic and atraumatic.     Right Ear: Tympanic membrane, ear canal and external ear normal.     Left Ear: Tympanic membrane, ear canal and external ear normal.     Nose: Nose normal.     Mouth/Throat:     Mouth: Mucous membranes are moist.     Pharynx: Oropharynx is clear. No oropharyngeal exudate or posterior oropharyngeal erythema.   Eyes:     General: No scleral icterus.    Extraocular Movements: Extraocular movements intact.     Conjunctiva/sclera: Conjunctivae normal.     Pupils: Pupils are equal, round, and reactive to light.   Neck:     Thyroid : No  thyromegaly or thyroid  tenderness.   Cardiovascular:     Rate and Rhythm: Normal rate and regular rhythm.     Pulses: Normal pulses.     Heart sounds: Normal heart sounds.  Pulmonary:     Effort: Pulmonary effort is normal. No tachypnea, bradypnea or respiratory distress.     Breath sounds: Normal breath sounds. No stridor. No wheezing, rhonchi or rales.  Abdominal:     General: Bowel sounds are normal. There is no distension.     Palpations: Abdomen is soft. There is no mass.     Tenderness: There is no abdominal tenderness. There is no guarding.     Hernia: No hernia is present.   Musculoskeletal:     Cervical back: Normal range of motion and neck supple.     Right lower leg: No edema.     Left lower leg: No edema.  Lymphadenopathy:     Cervical: No cervical adenopathy.   Skin:    General: Skin is warm and dry.   Neurological:     Mental Status: He is alert and oriented to person, place, and time. Mental status is at baseline.   Psychiatric:        Mood and Affect: Mood normal.        Behavior: Behavior normal.      Last depression screening scores    08/12/2023    3:06 PM 08/03/2021    2:11 PM 05/24/2021    1:39 PM  PHQ 2/9 Scores  PHQ - 2 Score 0 0 0  PHQ- 9 Score 0     Last fall risk screening    08/12/2023    3:06 PM  Fall Risk   Falls in the past year? 0  Number falls in past yr: 0  Injury with Fall? 0  Risk for fall due to : No  Fall Risks   Last Audit-C alcohol use screening    08/12/2023    3:04 PM  Alcohol Use Disorder Test (AUDIT)  1. How often do you have a drink containing alcohol? 0  2. How many drinks containing alcohol do you have on a typical day when you are drinking? 0  3. How often do you have six or more drinks on one occasion? 0  AUDIT-C Score 0   A score of 3 or more in women, and 4 or more in men indicates increased risk for alcohol abuse, EXCEPT if all of the points are from question 1   No results found for any visits on  08/12/23.  Assessment & Plan    Routine Health Maintenance and Physical Exam  Exercise Activities and Dietary recommendations  Goals   None      There is no immunization history on file for this patient.  Health Maintenance  Topic Date Due   COVID-19 Vaccine (1 - 2024-25 season) 11/27/2023 (Originally 10/28/2022)   DTaP/Tdap/Td (1 - Tdap) 08/11/2024 (Originally 10/28/1997)   Pneumococcal Vaccine 20-62 Years old (1 of 2 - PCV) 08/11/2024 (Originally 10/28/1997)   HPV VACCINES (1 - Male 3-dose series) 08/11/2024 (Originally 10/28/1993)   INFLUENZA VACCINE  09/27/2023   Hepatitis C Screening  Completed   HIV Screening  Completed   Meningococcal B Vaccine  Aged Out    Discussed health benefits of physical activity, and encouraged him to engage in regular exercise appropriate for his age and condition.   Annual physical exam  Seasonal allergic rhinitis due to pollen -     Fluticasone  Propionate; Place 1 spray into both nostrils 2 (two) times daily.  Dispense: 16 g; Refill: 6  Chronic diastolic heart failure (HCC) -     Basic metabolic panel with GFR -     Comprehensive metabolic panel with GFR  Primary hypertension -     Comprehensive metabolic panel with GFR  Screening for endocrine, metabolic and immunity disorder      Annual physical exam Routine physical examination.  Physical exam overall unremarkable except as noted above. Routine lab work ordered as noted.  Active lifestyle, low sodium and low starch diet. Eligible for COVID booster, HPV, and pneumonia vaccines. Uncertain Tdap status. Seasonal allergies managed with Flonase . - Recommend COVID booster vaccine. - Discuss HPV vaccine eligibility and benefits. - Check and update Tdap vaccination if necessary. - Discuss pneumonia vaccine eligibility. - Patient declined all vaccines - Encourage physical activity and balanced diet.  Seasonal allergic rhinitis - Prescribe Flonase ; advised on Flonase  availability over the  counter if unable to get refill.  Chronic diastolic heart failure; primary hypertension Well-controlled. Under cardiology care with multiple medications. Comprehensive metabolic panel required for liver function monitoring. - Order comprehensive metabolic panel. - Forward result to cardiology (unable to CC when ordering) to [facility] for ongoing management and follow-up.    Return in about 1 year (around 08/11/2024) for CPE.     I discussed the assessment and treatment plan with the patient  The patient was provided an opportunity to ask questions and all were answered. The patient agreed with the plan and demonstrated an understanding of the instructions.   The patient was advised to call back or seek an in-person evaluation if the symptoms worsen or if the condition fails to improve as anticipated.    Carlean Charter, DO  [facility] [phone] (phone) [phone] (fax)  [facility]

## 2023-08-13 LAB — COMPREHENSIVE METABOLIC PANEL WITH GFR
ALT: 21 IU/L (ref 0–44)
AST: 18 IU/L (ref 0–40)
Albumin: 4.3 g/dL (ref 4.1–5.1)
Alkaline Phosphatase: 101 IU/L (ref 44–121)
BUN/Creatinine Ratio: 12 (ref 9–20)
BUN: 18 mg/dL (ref 6–24)
Bilirubin Total: 0.5 mg/dL (ref 0.0–1.2)
CO2: 22 mmol/L (ref 20–29)
Calcium: 9.6 mg/dL (ref 8.7–10.2)
Chloride: 105 mmol/L (ref 96–106)
Creatinine, Ser: 1.46 mg/dL — ABNORMAL HIGH (ref 0.76–1.27)
Globulin, Total: 2.7 g/dL (ref 1.5–4.5)
Glucose: 88 mg/dL (ref 70–99)
Potassium: 4.1 mmol/L (ref 3.5–5.2)
Sodium: 143 mmol/L (ref 134–144)
Total Protein: 7 g/dL (ref 6.0–8.5)
eGFR: 60 mL/min/{1.73_m2} (ref >59)

## 2023-08-21 ENCOUNTER — Ambulatory Visit: Payer: Self-pay | Admitting: Family Medicine

## 2023-08-21 DIAGNOSIS — N182 Chronic kidney disease, stage 2 (mild): Secondary | ICD-10-CM

## 2023-09-13 ENCOUNTER — Emergency Department
Admission: EM | Admit: 2023-09-13 | Discharge: 2023-09-14 | Disposition: A | Discharge status: Home / Self Care | Attending: Emergency Medicine | Admitting: Emergency Medicine

## 2023-09-13 DIAGNOSIS — I5032 Chronic diastolic (congestive) heart failure: Secondary | ICD-10-CM | POA: Diagnosis not present

## 2023-09-13 DIAGNOSIS — Z23 Encounter for immunization: Secondary | ICD-10-CM | POA: Insufficient documentation

## 2023-09-13 DIAGNOSIS — S61211A Laceration without foreign body of left index finger without damage to nail, initial encounter: Secondary | ICD-10-CM | POA: Diagnosis present

## 2023-09-13 DIAGNOSIS — W232XXA Caught, crushed, jammed or pinched between a moving and stationary object, initial encounter: Secondary | ICD-10-CM | POA: Diagnosis not present

## 2023-09-13 DIAGNOSIS — I110 Hypertensive heart disease with heart failure: Secondary | ICD-10-CM | POA: Insufficient documentation

## 2023-09-13 MED ORDER — TETANUS-DIPHTH-ACELL PERTUSSIS 5-2.5-18.5 LF-MCG/0.5 IM SUSY
0.5000 mL | PREFILLED_SYRINGE | Freq: Once | INTRAMUSCULAR | Status: AC
  Administered 2023-09-13: 0.5 mL via INTRAMUSCULAR
  Filled 2023-09-13: qty 0.5

## 2023-09-13 MED ORDER — LIDOCAINE HCL (PF) 1 % IJ SOLN
5.0000 mL | Freq: Once | INTRAMUSCULAR | Status: AC
  Administered 2023-09-13: 5 mL
  Filled 2023-09-13: qty 5

## 2023-09-13 NOTE — ED Provider Notes (Signed)
 [facility] Provider Note    Event Date/Time   First MD Initiated Contact with Patient 09/13/23 2306     (approximate)   History   Laceration    HPI  John Lowe is a 43 y.o. male    with a past medical history of chronic diastolic heart failure, essential hypertension, mixed hyperlipidemia, who presents to the ED complaining of laceration to the left index finger. According to the patient, he cut his finger with a garage door.  Patient cannot remember last Tdap.     Patient Active Problem List   Diagnosis Date Noted   Hospital discharge follow-up 06/08/2021   Encounter for hepatitis C screening test for low risk patient 06/08/2021   Morbid obesity (HCC) 06/08/2021   Acute on chronic HFrEF (heart failure with reduced ejection fraction) (HCC) 06/08/2021   Family history of hypertension 06/08/2021   Family history of diabetes mellitus type II 06/08/2021   Family history of hyperlipidemia 06/08/2021   Non compliance w medication regimen 05/14/2021   Elevated troponin 05/14/2021   Hypokalemia 05/14/2021   Elevated glucose 04/01/2017   Medication monitoring encounter 10/02/2015   Hypertension 08/15/2015   Hyperlipidemia 08/15/2015   Allergic rhinitis 08/15/2015   Obesity, Class III, BMI 40-49.9 (morbid obesity) (HCC) 08/31/2014     ROS: Patient currently denies any vision changes, tinnitus, difficulty speaking, facial droop, sore throat, chest pain, shortness of breath, abdominal pain, nausea/vomiting/diarrhea, dysuria, or weakness/numbness/paresthesias in any extremity   Physical Exam   Triage Vital Signs: ED Triage Vitals .  Encounter Vitals Group     BP (!) 152/127     Girls Systolic BP Percentile      Girls Diastolic BP Percentile      Boys Systolic BP Percentile      Boys Diastolic BP Percentile      Pulse Rate 80     Resp 18     Temp 97.7 F (36.5 C)     Temp src      SpO2 100 %     Weight (!) 339 lb (153.8 kg)     Height 6'  3 (1.905 m)     Head Circumference      Peak Flow      Pain Score 8     Pain Loc      Pain Education      Exclude from Growth Chart     Most recent vital signs: Vitals:   09/13/23 2107  BP: (!) 152/127  Pulse: 80  Resp: 18  Temp: 97.7 F (36.5 C)  SpO2: 100%     Physical Exam Vitals and nursing note reviewed.  During triage patient was hypertensive  Constitutional:      General: Awake and alert. No acute distress.    Appearance: Normal appearance. The patient is normal weight.      Able to speak in complete sentences without cough or dyspnea  HENT:     Head: Normocephalic and atraumatic.     Mouth: Mucous membranes are moist.  Eyes:     General: PERRL. Normal EOMs          Conjunctiva/sclera: Conjunctivae normal.  Nose No congestion/rhinorrhea  CV:                  Good peripheral perfusion.  Regular rate and rhythm  Resp:               Normal effort.  Equal breath sounds bilaterally.  Abd:  No distention.  Soft, nontender.  No rebound or guarding.  Musculoskeletal:        General: No swelling. Normal range of motion.  Left second finger: Presence of avulsion of the skin in the first proximal phalange.  Full ROM.  Mild active bleeding. Skin:    General: Skin is warm and dry.     Capillary Refill: Capillary refill takes less than 2 seconds.     Findings: No rash.  Neurological:     Mental Status: The patient is awake and alert. MAE spontaneously. No gross focal neurologic deficits are appreciated.  Psychiatric Mood and affect are normal. Speech and behavior are normal.  ED Results / Procedures / Treatments   Labs (all labs ordered are listed, but only abnormal results are displayed) Labs Reviewed - No data to display   EKG      PROCEDURES:  Critical Care performed:   .Laceration Repair  Date/Time: 09/14/2023 12:10 AM  Performed by: Janit Kast, PA-C Authorized by: Janit Kast, PA-C   Consent:    Consent given by:   Patient   Risks, benefits, and alternatives were discussed: yes     Risks discussed:  Pain, infection and poor cosmetic result Universal protocol:    Procedure explained and questions answered to patient or proxy's satisfaction: yes     Patient identity confirmed:  Verbally with patient Anesthesia:    Anesthesia method:  Local infiltration   Local anesthetic:  Lidocaine  1% WITH epi Laceration details:    Location:  Finger   Finger location:  L index finger   Length (cm):  2   Depth (mm):  5 Pre-procedure details:    Preparation:  Patient was prepped and draped in usual sterile fashion Exploration:    Limited defect created (wound extended): no     Hemostasis achieved with:  Direct pressure   Contaminated: no   Treatment:    Area cleansed with:  Povidone-iodine   Amount of cleaning:  Extensive   Irrigation solution:  Sterile saline   Irrigation volume:  350   Irrigation method:  Syringe   Visualized foreign bodies/material removed: no     Debridement:  None   Layers/structures repaired:  Muscle belly Muscle belly:    Suture size:  5-0   Suture material:  Vicryl   Suture technique:  Simple interrupted   Number of sutures:  3 Skin repair:    Repair method:  Sutures   Suture size:  5-0   Suture material:  Nylon   Suture technique:  Simple interrupted   Number of sutures:  6 Approximation:    Approximation:  Close Repair type:    Repair type:  Simple Post-procedure details:    Dressing:  Non-adherent dressing   Procedure completion:  Tolerated well, no immediate complications    MEDICATIONS ORDERED IN ED: Medications  Tdap (BOOSTRIX) injection 0.5 mL (0.5 mLs Intramuscular Given 09/13/23 2322)  lidocaine  (PF) (XYLOCAINE ) 1 % injection 5 mL (5 mLs Other Given by Other 09/13/23 2321)      IMPRESSION / MDM / ASSESSMENT AND PLAN / ED COURSE  I reviewed the triage vital signs and the nursing notes.  Differential diagnosis includes, but is not limited to, avulsion,  fracture, laceration, abscess, foreign body  Patient's presentation is most consistent with acute, uncomplicated illness.    John Lowe is a 43 y.o., male presents today with history of laceration of his left second finger with a garage door.  Patient cannot remember last Tdap.  Physical  exam left second finger has a laceration with avulsion of the muscle.  Full ROM.  Patient's diagnosis is consistent with laceration of the left second finger.  Did not order any imaging or labs because physical exam is reassuring. I did review the patient's allergies and medications.The patient is in stable and satisfactory condition for discharge home.  During admission patient received Tdap booster.  Patient will be discharged home without prescriptions. Patient is to follow up with PCP for suture removal in 8 days as needed or otherwise directed. Patient is given ED precautions to return to the ED for any worsening or new symptoms.  Advised patient to take ibuprofen  as needed for pain Discussed plan of care with patient, answered all of patient's questions, Patient agreeable to plan of care. Advised patient to take medications according to the instructions on the label. Discussed possible side effects of new medications. Patient verbalized understanding.  FINAL CLINICAL IMPRESSION(S) / ED DIAGNOSES   Final diagnoses:  Laceration of left index finger without foreign body without damage to nail, initial encounter     Rx / DC Orders   ED Discharge Orders     None        Note:  This document was prepared using Dragon voice recognition software and may include unintentional dictation errors.   Janit Kast, PA-C 09/14/23 0012    Jacolyn Pae, MD 09/16/23 1515

## 2023-09-13 NOTE — ED Triage Notes (Signed)
 Pt reports laceration to left index finger from getting caught on garage door... bleeding controlled. Unknown last tetanus

## 2023-09-14 NOTE — Discharge Instructions (Addendum)
 You have been diagnosed with laceration of the left second finger.  There is no submerge your finger in water.  Please change the dressing in 3 days.  You can take ibuprofen  every 8 hours after meals as needed for pain.  Please come back to ED or go to your PCP if you have new symptoms or symptoms worsen.

## 2023-09-22 ENCOUNTER — Other Ambulatory Visit: Payer: Self-pay

## 2023-09-22 ENCOUNTER — Emergency Department
Admission: EM | Admit: 2023-09-22 | Discharge: 2023-09-22 | Disposition: A | Discharge status: Home / Self Care | Attending: Emergency Medicine | Admitting: Emergency Medicine

## 2023-09-22 DIAGNOSIS — W268XXD Contact with other sharp object(s), not elsewhere classified, subsequent encounter: Secondary | ICD-10-CM | POA: Diagnosis not present

## 2023-09-22 DIAGNOSIS — S61211D Laceration without foreign body of left index finger without damage to nail, subsequent encounter: Secondary | ICD-10-CM | POA: Insufficient documentation

## 2023-09-22 DIAGNOSIS — I110 Hypertensive heart disease with heart failure: Secondary | ICD-10-CM | POA: Diagnosis not present

## 2023-09-22 DIAGNOSIS — Z4801 Encounter for change or removal of surgical wound dressing: Secondary | ICD-10-CM | POA: Diagnosis present

## 2023-09-22 DIAGNOSIS — Z4889 Encounter for other specified surgical aftercare: Secondary | ICD-10-CM

## 2023-09-22 DIAGNOSIS — I509 Heart failure, unspecified: Secondary | ICD-10-CM | POA: Diagnosis not present

## 2023-09-22 MED ORDER — CEPHALEXIN 500 MG PO CAPS: 500.0000 mg | ORAL_CAPSULE | Freq: Three times a day (TID) | ORAL | 0 refills | Status: AC

## 2023-09-22 NOTE — ED Provider Notes (Signed)
 [facility] Provider Note    Event Date/Time   First MD Initiated Contact with Patient 09/22/23 1151     (approximate)   History   Suture / Staple Removal   HPI  John Lowe is a 43 y.o. male  with history of CHF, hypertension and as listed in EMR presents to the emergency department for treatment and evaluation of sutured wound to left index finger.  Cut his finger on his garage door and sutures were inserted here on 09/13/2023.  He has not removed the dressing all the way down to the sutures because there was dried blood on it and he was afraid to pull it off because he did not want to pull out the stitches.  He denies noted drainage, redness, or decrease in mobility of the finger.     Physical Exam    Vitals:   09/22/23 1148  BP: (!) 151/105  Pulse: 72  Resp: 20  Temp: 98.3 F (36.8 C)  SpO2: 100%    General: Awake, no distress.  CV:  Good peripheral perfusion.  Resp:  Normal effort.  Abd:  No distention.  Other:  Xeroform gauze dried over sutured wound on left index finger.  Once removed, 2 sutures had pulled through on lateral edge of wound. There is a scant amount of white drainage. Remaining sutures to proximal aspect of wound intact. No surrounding erythema or drainage.  Wound edges not well healed.   ED Results / Procedures / Treatments   Labs (all labs ordered are listed, but only abnormal results are displayed)  Labs Reviewed - No data to display   EKG  Not indicated.   RADIOLOGY  Image and radiology report reviewed and interpreted by me. Radiology report consistent with the same.  Not indicated.  PROCEDURES:  Critical Care performed: No  Procedures   MEDICATIONS ORDERED IN ED:  Medications - No data to display   IMPRESSION / MDM / ASSESSMENT AND PLAN / ED COURSE   I have reviewed the triage note and vital signs. Vital signs stable--hypertension noted, patient asymptomatic with history of  hypertension.   Differential diagnosis includes, but is not limited to, visit for suture removal, wound check, cellulitis  Patient's presentation is most consistent with acute illness / injury with system symptoms.  43 year old male presents to the emergency department for suture removal.  Sutures were inserted here 9 days ago.  On exam, the Xeroform gauze has dried to the wound edges and over the sutures.  Hydrogen peroxide and saline used to remove gauze.  2 of the sutures at the distal end of the wound were dehisced.  There was a scant amount of white drainage in that area of the wound.  The remaining sutures are intact and there is no surrounding erythema and no drainage.  The 2 sutures that were dehisced were removed.  Remaining sutures left in place.  Plan will be to empirically treat with Keflex  to prevent infection.  He was advised to change the bandage daily and use antibiotic ointment with each dressing change.  He was encouraged to see his primary care provider in about 3 days for suture removal however he states that when he called to schedule an appointment he was advised they will not remove the sutures because they did not insert them.  He was then advised to see someone at urgent care for suture removal/wound recheck in 3 days.  If symptoms change or worsen or he develops new concerns he  was encouraged to return to the emergency department.      FINAL CLINICAL IMPRESSION(S) / ED DIAGNOSES   Final diagnoses:  Suture check     Rx / DC Orders   ED Discharge Orders          Ordered    cephALEXin  (KEFLEX ) 500 MG capsule  3 times daily        09/22/23 1215             Note:  This document was prepared using Dragon voice recognition software and may include unintentional dictation errors.   Herlinda Kirk NOVAK, FNP 09/22/23 1227    Dicky Anes, MD 09/22/23 [phone]

## 2023-09-22 NOTE — ED Triage Notes (Signed)
 Pt to ED for suture removal. L index finger. Pt in NAD. Finger is wrapped. Did not take BP meds today.

## 2023-09-22 NOTE — Discharge Instructions (Addendum)
 Please take the antibiotic as prescribed.  Clean with normal saline every day, allow it to completely dry, then apply antibiotic ointment before reapplying bandage.

## 2023-09-25 ENCOUNTER — Ambulatory Visit
Admission: EM | Admit: 2023-09-25 | Discharge: 2023-09-25 | Disposition: A | Discharge status: Home / Self Care | Attending: Nurse Practitioner | Admitting: Nurse Practitioner

## 2023-09-25 DIAGNOSIS — Z4802 Encounter for removal of sutures: Secondary | ICD-10-CM

## 2023-09-25 NOTE — Discharge Instructions (Addendum)
 You were seen today for removal of the remaining sutures in your left index finger following a laceration that was initially repaired on September 13, 2023. The wound is healing well, with no signs of infection or complications. All remaining sutures were removed without issue. You may gently clean the area with soap and water once daily and pat it dry. Apply a thin layer of petroleum jelly or antibiotic ointment if directed, and keep the wound covered with a clean, dry bandage for the next 1-2 days.  To reduce discomfort, you may take over-the-counter pain relievers such as acetaminophen  or ibuprofen  as needed. Avoid heavy lifting or activities that place strain on the finger for the next several days to allow the wound to fully close and minimize scarring.  Monitor the area closely for any signs of infection, including increased redness, swelling, warmth, pus-like drainage, or worsening pain. Seek medical attention if you develop a fever, the wound reopens, or symptoms worsen. Contact your primary care provider if you have concerns about healing or if symptoms persist.

## 2023-09-25 NOTE — ED Triage Notes (Addendum)
 Patient to Urgent Care for suture removal.  Patient had sutures placed to L index finger 7/18 after getting his finger caught on a garage door. Wound was re-evaluated in ER 7/27 (2 sutures removed at that time). Comes for removal of 4 remaining sutures.   Currently taking keflex  d/t wound infection.

## 2023-09-25 NOTE — ED Provider Notes (Signed)
 John Lowe    CSN: [id number] Arrival date & time: 09/25/23  1406      History   Chief Complaint Chief Complaint  Patient presents with   Suture / Staple Removal    HPI John Lowe is a 44 y.o. male.    Patient presents for suture removal following a laceration to the left index finger sustained on 09/13/2023, which was initially evaluated and repaired with stitches at [facility] ED. He returned on 09/22/2023 for suture removal, at which time two distal sutures were noted to be dehisced with scant white drainage but no surrounding erythema. Those two sutures were removed, and the remaining six were left in place. He was empirically started on Keflex  to prevent infection and instructed to return in three days for re-evaluation and removal of the remaining sutures. Today, the patient reports full compliance with the antibiotic regimen. He endorses mild tenderness at the site but denies any redness, swelling, drainage, or systemic symptoms such as fever.  The following portions of the patient's history were reviewed and updated as appropriate: allergies, current medications, past family history, past medical history, past social history, past surgical history, and problem list.     Past Medical History:  Diagnosis Date   Arthritis    CHF (congestive heart failure) (HCC)    Hyperlipidemia    Hypertension    Obesity, Class III, BMI 40-49.9 (morbid obesity)     Patient Active Problem List   Diagnosis Date Noted   Hospital discharge follow-up 06/08/2021   Encounter for hepatitis C screening test for low risk patient 06/08/2021   Morbid obesity (HCC) 06/08/2021   Acute on chronic HFrEF (heart failure with reduced ejection fraction) (HCC) 06/08/2021   Family history of hypertension 06/08/2021   Family history of diabetes mellitus type II 06/08/2021   Family history of hyperlipidemia 06/08/2021   Non compliance w medication regimen 05/14/2021    Elevated troponin 05/14/2021   Hypokalemia 05/14/2021   Elevated glucose 04/01/2017   Medication monitoring encounter 10/02/2015   Hypertension 08/15/2015   Hyperlipidemia 08/15/2015   Allergic rhinitis 08/15/2015   Obesity, Class III, BMI 40-49.9 (morbid obesity) (HCC) 08/31/2014    Past Surgical History:  Procedure Laterality Date   ANKLE SURGERY     rotator cuff Right        Home Medications    Prior to Admission medications   Medication Sig Start Date End Date Taking? Authorizing Provider  aspirin  81 MG tablet Take 81 mg by mouth daily.    [provider]  atorvastatin  (LIPITOR) 40 MG tablet TAKE 1 TABLET BY MOUTH AT BEDTIME 04/01/23   Donette [place] A, FNP  cephALEXin  (KEFLEX ) 500 MG capsule Take 1 capsule (500 mg total) by mouth 3 (three) times daily for 7 days. 09/22/23 09/29/23  Herlinda Belton B, FNP  FARXIGA  10 MG TABS tablet TAKE 1 TABLET BY MOUTH ONCE DAILY BEFORE BREAKFAST 06/26/23   Donette [place] A, FNP  fluticasone  (FLONASE ) 50 MCG/ACT nasal spray Place 1 spray into both nostrils 2 (two) times daily. 08/12/23   Donzella Lauraine SAILOR, DO  furosemide  (LASIX ) 40 MG tablet Take 1 tablet (40 mg total) by mouth daily. 06/18/23   Donette City LABOR, FNP  isosorbide -hydrALAZINE  (BIDIL) 20-37.5 MG tablet TAKE 1 TABLET BY MOUTH THREE TIMES DAILY 02/28/23   Donette [place] A, FNP  metoprolol  succinate (TOPROL -XL) 100 MG 24 hr tablet Take 0.5 tablets (50 mg total) by mouth daily. Take with or immediately following a meal. 07/18/23  10/16/23  Donette Ellouise LABOR, FNP  sacubitril -valsartan  (ENTRESTO ) 49-51 MG Take 1 tablet by mouth twice daily 05/15/23   Donette Ellouise LABOR, FNP  spironolactone  (ALDACTONE ) 25 MG tablet Take 1 tablet by mouth once daily 03/07/23   Donette Ellouise LABOR, FNP    Family History Family History  Problem Relation Age of Onset   Hypertension Mother    Hypertension Father    Heart disease Father    Kidney disease Father    Hypertension Sister    Hypertension Brother    Cancer  Maternal Grandmother        breast   Cancer Maternal Grandfather        bone cancer   Hypertension Paternal Grandmother     Social History Social History   Tobacco Use   Smoking status: Never   Smokeless tobacco: Never  Vaping Use   Vaping status: Never Used  Substance Use Topics   Alcohol use: No   Drug use: No     Allergies   Patient has no known allergies.   Review of Systems Review of Systems  Constitutional:  Negative for fever.  Skin:  Positive for wound.  All other systems reviewed and are negative.    Physical Exam Triage Vital Signs ED Triage Vitals  Encounter Vitals Group     BP 09/25/23 1702 (!) 159/105     Girls Systolic BP Percentile --      Girls Diastolic BP Percentile --      Boys Systolic BP Percentile --      Boys Diastolic BP Percentile --      Pulse Rate 09/25/23 1702 65     Resp 09/25/23 1702 18     Temp 09/25/23 1702 97.8 F (36.6 C)     Temp src --      SpO2 09/25/23 1702 97 %     Weight --      Height --      Head Circumference --      Peak Flow --      Pain Score 09/25/23 1652 3     Pain Loc --      Pain Education --      Exclude from Growth Chart --    No data found.  Updated Vital Signs BP (!) 159/105   Pulse 65   Temp 97.8 F (36.6 C)   Resp 18   SpO2 97%   Visual Acuity Right Eye Distance:   Left Eye Distance:   Bilateral Distance:    Right Eye Near:   Left Eye Near:    Bilateral Near:     Physical Exam Vitals reviewed.  Constitutional:      General: He is awake. He is not in acute distress.    Appearance: Normal appearance. He is well-developed. He is not ill-appearing, toxic-appearing or diaphoretic.  HENT:     Head: Normocephalic.     Right Ear: Hearing normal.     Left Ear: Hearing normal.     Nose: Nose normal.     Mouth/Throat:     Mouth: Mucous membranes are moist.  Eyes:     General: Vision grossly intact.     Conjunctiva/sclera: Conjunctivae normal.  Cardiovascular:     Rate and Rhythm:  Normal rate and regular rhythm.     Heart sounds: Normal heart sounds.  Pulmonary:     Effort: Pulmonary effort is normal.     Breath sounds: Normal breath sounds and air entry.  Musculoskeletal:  General: Normal range of motion.     Cervical back: Full passive range of motion without pain, normal range of motion and neck supple.  Skin:    General: Skin is warm and dry.     Comments: Sutures x4 noted to palmar aspect of the left index finger. No drainage, redness, or swelling noted.   Neurological:     General: No focal deficit present.     Mental Status: He is alert and oriented to person, place, and time.  Psychiatric:        Speech: Speech normal.        Behavior: Behavior is cooperative.     UC Treatments / Results  Labs (all labs ordered are listed, but only abnormal results are displayed) Labs Reviewed - No data to display  EKG   Radiology No results found.  Procedures Wound Care  Date/Time: 09/25/2023 5:41 PM  Performed by: Iola Lukes, FNP Authorized by: Iola Lukes, FNP   Consent:    Consent obtained:  Verbal   Consent given by:  Patient   Risks, benefits, and alternatives were discussed: yes     Risks discussed:  Bleeding, infection and pain Universal protocol:    Patient identity confirmed:  Verbally with patient Sedation:    Sedation type:  None Anesthesia:    Anesthesia method:  None Procedure details:    Wound location:  Finger   Finger location:  L index finger Skin layer closed with:    Wound care performed:  Sutures removed Post-procedure details:    Procedure completion:  Tolerated well, no immediate complications  (including critical care time)  Medications Ordered in UC Medications - No data to display  Initial Impression / Assessment and Plan / UC Course  I have reviewed the triage vital signs and the nursing notes.  Pertinent labs & imaging results that were available during my care of the patient were reviewed by  me and considered in my medical decision making (see chart for details).    Patient presents for follow-up and removal of sutures following a left index finger laceration initially repaired on 09/13/2023. At prior visit on 09/22/2023, two distal sutures were noted to be dehisced with scant white drainage but no surrounding erythema, and those were removed. The remaining six sutures were left in place, and the patient was started on Keflex  empirically to prevent infection. Today, he reports compliance with the antibiotic regimen and endorses only mild tenderness at the site. There is no swelling, erythema, drainage, or signs of systemic infection. Wound appears well-approximated and healing appropriately. Remaining sutures removed without complication. Patient advised to monitor for signs of infection including increased redness, swelling, pain, drainage, or fever, and to follow up with primary care or return to the ED if any of these symptoms develop.  Today's evaluation has revealed no signs of a dangerous process. Discussed diagnosis with patient and/or guardian. Patient and/or guardian aware of their diagnosis, possible red flag symptoms to watch out for and need for close follow up. Patient and/or guardian understands verbal and written discharge instructions. Patient and/or guardian comfortable with plan and disposition.  Patient and/or guardian has a clear mental status at this time, good insight into illness (after discussion and teaching) and has clear judgment to make decisions regarding their care  Documentation was completed with the aid of voice recognition software. Transcription may contain typographical errors. Final Clinical Impressions(s) / UC Diagnoses   Final diagnoses:  Visit for suture removal     Discharge  Instructions      You were seen today for removal of the remaining sutures in your left index finger following a laceration that was initially repaired on September 13, 2023. The  wound is healing well, with no signs of infection or complications. All remaining sutures were removed without issue. You may gently clean the area with soap and water once daily and pat it dry. Apply a thin layer of petroleum jelly or antibiotic ointment if directed, and keep the wound covered with a clean, dry bandage for the next 1-2 days.  To reduce discomfort, you may take over-the-counter pain relievers such as acetaminophen  or ibuprofen  as needed. Avoid heavy lifting or activities that place strain on the finger for the next several days to allow the wound to fully close and minimize scarring.  Monitor the area closely for any signs of infection, including increased redness, swelling, warmth, pus-like drainage, or worsening pain. Seek medical attention if you develop a fever, the wound reopens, or symptoms worsen. Contact your primary care provider if you have concerns about healing or if symptoms persist.      ED Prescriptions   None    PDMP not reviewed this encounter.   Iola Lukes, [place] 09/25/23 1743

## 2023-11-21 ENCOUNTER — Encounter: Admitting: Family

## 2023-11-28 ENCOUNTER — Telehealth: Payer: Self-pay | Admitting: Family

## 2023-11-28 NOTE — Progress Notes (Unsigned)
 Advanced [facility] Failure Clinic Note    PCP: Emilio Marseille, NP  Primary Cardiologist: [facility]   Chief Complaint: HF visit   HPI:  John Lowe is a 43 y/o male with a history of hyperlipidemia, HTN, obesity and chronic heart failure (onset 03/23)   Admitted 05/14/21 with 3 weeks of worsening SOB, orthopnea and wheezing. Echo 05/15/21: EF of 45-50% along with moderate LVH and severe LAE. Had not been taking his meds for previous month due to finances. BP significantly elevated. BP meds started.    Echo 09/06/22: EF 60-65% with mild LVH, Grade I DD, moderate LAE and mild John.   Was in the ED 09/13/23 for a laceration of finger.   He presents today with a chief complaint of a HF visit. Denies feeling short of breath or fatigued. He hasn't noticed snoring but says that he's been told that he snores. Weight has gone up as he says that he eats all the time.   ROS: All systems negative except as listed in HPI, PMH and Problem List.  SH:  Social History   Socioeconomic History   Marital status: Married    Spouse name: Not on file   Number of children: Not on file   Years of education: Not on file   Highest education level: Not on file  Occupational History   Not on file  Tobacco Use   Smoking status: Never   Smokeless tobacco: Never  Vaping Use   Vaping status: Never Used  Substance and Sexual Activity   Alcohol use: No   Drug use: No   Sexual activity: Not Currently  Other Topics Concern   Not on file  Social History Narrative   Not on file   Social Drivers of Health   Financial Resource Strain: Low Risk  (08/12/2023)   Overall Financial Resource Strain (CARDIA)    Difficulty of Paying Living Expenses: Not hard at all  Food Insecurity: No Food Insecurity (08/12/2023)   Hunger Vital Sign    Worried About Running Out of Food in the Last Year: Never true    Ran Out of Food in the Last Year: Never true  Transportation Needs: No Transportation Needs (08/12/2023)   PRAPARE -  Administrator, Civil Service (Medical): No    Lack of Transportation (Non-Medical): No  Physical Activity: Sufficiently Active (08/12/2023)   Exercise Vital Sign    Days of Exercise per Week: 7 days    Minutes of Exercise per Session: 60 min  Stress: No Stress Concern Present (08/12/2023)   [employer] of Occupational Health - Occupational Stress Questionnaire    Feeling of Stress: Not at all  Social Connections: Socially Integrated (08/12/2023)   Social Connection and Isolation Panel    Frequency of Communication with Friends and Family: More than three times a week    Frequency of Social Gatherings with Friends and Family: Three times a week    Attends Religious Services: More than 4 times per year    Active Member of Clubs or Organizations: Yes    Attends Banker Meetings: More than 4 times per year    Marital Status: Married  Catering manager Violence: Not At Risk (08/12/2023)   Humiliation, Afraid, Rape, and Kick questionnaire    Fear of Current or Ex-Partner: No    Emotionally Abused: No    Physically Abused: No    Sexually Abused: No    FH:  Family History  Problem Relation Age of Onset  Hypertension Mother    Hypertension Father    Heart disease Father    Kidney disease Father    Hypertension Sister    Hypertension Brother    Cancer Maternal Grandmother        breast   Cancer Maternal Grandfather        bone cancer   Hypertension Paternal Grandmother     Past Medical History:  Diagnosis Date   Arthritis    CHF (congestive heart failure) (HCC)    Hyperlipidemia    Hypertension    Obesity, Class III, BMI 40-49.9 (morbid obesity)     Current Outpatient Medications  Medication Sig Dispense Refill   aspirin  81 MG tablet Take 81 mg by mouth daily.     atorvastatin  (LIPITOR) 40 MG tablet TAKE 1 TABLET BY MOUTH AT BEDTIME 90 tablet 3   FARXIGA  10 MG TABS tablet TAKE 1 TABLET BY MOUTH ONCE DAILY BEFORE BREAKFAST 30 tablet 5    fluticasone  (FLONASE ) 50 MCG/ACT nasal spray Place 1 spray into both nostrils 2 (two) times daily. 16 g 6   furosemide  (LASIX ) 40 MG tablet Take 1 tablet (40 mg total) by mouth daily. 30 tablet 5   isosorbide -hydrALAZINE  (BIDIL) 20-37.5 MG tablet TAKE 1 TABLET BY MOUTH THREE TIMES DAILY 90 tablet 11   metoprolol  succinate (TOPROL -XL) 100 MG 24 hr tablet Take 0.5 tablets (50 mg total) by mouth daily. Take with or immediately following a meal. 45 tablet 3   sacubitril -valsartan  (ENTRESTO ) 49-51 MG Take 1 tablet by mouth twice daily 60 tablet 5   spironolactone  (ALDACTONE ) 25 MG tablet Take 1 tablet by mouth once daily 90 tablet 3   No current facility-administered medications for this visit.   Vitals:   11/29/23 1418  BP: (!) 146/96  Pulse: 61  SpO2: 96%  Weight: (!) 352 lb (159.7 kg)   Wt Readings from Last 3 Encounters:  11/29/23 (!) 352 lb (159.7 kg)  09/22/23 (!) 339 lb (153.8 kg)  09/13/23 (!) 339 lb (153.8 kg)   Lab Results  Component Value Date   CREATININE 1.46 (H) 08/12/2023   CREATININE 1.10 02/28/2023   CREATININE 1.26 (H) 09/03/2022    PHYSICAL EXAM:  General: Well appearing.  Cor: No JVD. Regular rhythm, rate.  Lungs: clear Abdomen: soft, nontender, nondistended. Extremities: no edema. Warm to touch.  Neuro:. Affect pleasant    ECG: not done   ASSESSMENT & PLAN:  1: NICM with preserved ejection fraction- - HF likely due to HTN - NYHA class I - euvolemic today - weight up 5 pounds from last visit here 5 months ago - Echo 05/15/21: EF of 45-50% along with moderate LVH and severe LAE.  - Echo 09/06/22: EF 60-65% with mild LVH, Grade I DD, moderate LAE and mild John - not adding salt and has been reading food labels for sodium content; reviewed the importance of keeping daily sodium to < 2000mg  / day - keeping his daily fluid intake to between 60-64 ounces - his brother has been diagnosed with HF. Genetic buccal test was indeterminate. He declines genetic  counseling today.  - continue farxiga  10mg  daily - continue furosemide  40mg  daily  - continue bidil 1 tablet TID - continue metoprolol  50mg  daily  - continue entresto  49/51mg  BID - continue spironolactone  25mg  daily - BNP 05/14/21 was 286.7  2: HTN- - BP 146/96 - saw PCP (Pardue) 06/25 - BMP 08/12/23 reviewed: sodium 143, potassium 4.1, creatinine 1.46 and GFR 60 - BMET today  3: Obesity- -  no GLP's will be approved with his insurance unless his AIC>6.5% - A1c 06/08/21 was 5.8% and he declines getting it re-tested   4: Hyperlipidemia- - LDL 02/28/23 was 79 - continue atorvastatin  40mg  daily  5: Snoring- - [place]amar sleep study to rule out sleep apnea - if he has sleep apnea, this could be contributing to his HTN   Since he's not seeing cardiology, will have him see HF MD at next visit in 1 month.   I spent 20 minutes reviewing records, interviewing/ examing patient and managing plan/ orders.   Ellouise DELENA Class, FNP 11/28/23

## 2023-11-28 NOTE — Telephone Encounter (Signed)
 Called to confirm/remind patient of their appointment at the [facility] on 11/29/23.   Appointment:   [] Confirmed  [x] Left mess   [] No answer/No voice mail  [] VM Full/unable to leave message  [] Phone not in service  Patient reminded to bring all medications and/or complete list.  Confirmed patient has transportation. Gave directions, instructed to utilize valet parking.

## 2023-11-29 ENCOUNTER — Ambulatory Visit: Payer: Self-pay | Attending: Family | Admitting: Family

## 2023-11-29 ENCOUNTER — Encounter: Payer: Self-pay | Admitting: Family

## 2023-11-29 VITALS — BP 146/96 | HR 61 | Wt 352.0 lb

## 2023-11-29 DIAGNOSIS — Z79899 Other long term (current) drug therapy: Secondary | ICD-10-CM | POA: Diagnosis not present

## 2023-11-29 DIAGNOSIS — E785 Hyperlipidemia, unspecified: Secondary | ICD-10-CM | POA: Diagnosis present

## 2023-11-29 DIAGNOSIS — R0683 Snoring: Secondary | ICD-10-CM | POA: Diagnosis not present

## 2023-11-29 DIAGNOSIS — I10 Essential (primary) hypertension: Secondary | ICD-10-CM | POA: Diagnosis not present

## 2023-11-29 DIAGNOSIS — Z7982 Long term (current) use of aspirin: Secondary | ICD-10-CM | POA: Insufficient documentation

## 2023-11-29 DIAGNOSIS — E669 Obesity, unspecified: Secondary | ICD-10-CM | POA: Diagnosis present

## 2023-11-29 DIAGNOSIS — E782 Mixed hyperlipidemia: Secondary | ICD-10-CM

## 2023-11-29 DIAGNOSIS — I428 Other cardiomyopathies: Secondary | ICD-10-CM | POA: Diagnosis not present

## 2023-11-29 DIAGNOSIS — I5032 Chronic diastolic (congestive) heart failure: Secondary | ICD-10-CM | POA: Diagnosis not present

## 2023-11-29 DIAGNOSIS — I110 Hypertensive heart disease with heart failure: Secondary | ICD-10-CM | POA: Insufficient documentation

## 2023-11-29 DIAGNOSIS — Z7984 Long term (current) use of oral hypoglycemic drugs: Secondary | ICD-10-CM | POA: Diagnosis not present

## 2023-11-29 NOTE — Patient Instructions (Signed)
 Medication Changes:  NO MEDICATION CHANGES.   Lab Work:  Go downstairs to [facility] on [address] to have your blood work completed.  We will only call you if the results are abnormal or if the provider would like to make medication changes.  No news is good news.   Testing/Procedures:  We have given you a sleep study box. Please do NOT open your sleep study box until we call you with your insurance's approval. Insurance companies are a little short staffed currently, so it can take up to a few weeks to receive the approval. Your pin will be [id number].    Follow-Up in: 1 MONTH WITH DR.    Thank you for choosing [facility].    At the [facility], you and your health needs are our priority. We have a designated team specialized in the treatment of Heart Failure. This Care Team includes your primary Heart Failure Specialized Cardiologist (physician), Advanced Practice Providers (APPs- Physician Assistants and Nurse Practitioners), and Pharmacist who all work together to provide you with the care you need, when you need it.   You may see any of the following providers on your designated Care Team at your next follow up:  Dr. Toribio Fuel Dr. Ezra Shuck Dr. Ria Commander Dr. Morene Brownie Ellouise Class, FNP Jaun Bash, RPH-CPP  Please be sure to bring in all your medications bottles to every appointment.   Need to Contact Us :  If you have any questions or concerns before your next appointment please send us  a message through [place] or call our office at [phone].    TO LEAVE A MESSAGE FOR THE NURSE SELECT OPTION 2, PLEASE LEAVE A MESSAGE INCLUDING: YOUR NAME DATE OF BIRTH CALL BACK NUMBER REASON FOR CALL**this is important as we prioritize the call backs  YOU WILL RECEIVE A CALL BACK THE SAME DAY AS LONG AS YOU CALL BEFORE 4:00 PM

## 2023-11-29 NOTE — Progress Notes (Signed)
 Patient Name: John Lowe        DOB: [birth year]      Height:  6'3    Weight: 352 lbs  Office Name:          Referring Provider: Ellouise Class, FNP  Today's Date: 11/29/2023  Date:  11/29/2023 STOP BANG RISK ASSESSMENT S (snore) Have you been told that you snore?     YES   T (tired) Are you often tired, fatigued, or sleepy during the day?   NO  O (obstruction) Do you stop breathing, choke, or gasp during sleep? NO   P (pressure) Do you have or are you being treated for high blood pressure? YES   B (BMI) Is your body index greater than 35 kg/m? YES   A (age) Are you 46 years old or older? NO   N (neck) Do you have a neck circumference greater than 16 inches?   YES   G (gender) Are you a male? YES   TOTAL STOP/BANG "YES" ANSWERS  5                                                                        For Office Use Only              Procedure Order Form    YES to 3+ Stop Bang questions OR two clinical symptoms - patient qualifies for WatchPAT (CPT 95800)             Clinical Notes: Will consult Sleep Specialist and refer for management of therapy due to patient increased risk of Sleep Apnea. Ordering a sleep study due to the following two clinical symptoms: Excessive daytime sleepiness G47.10 / Gastroesophageal reflux K21.9 / Nocturia R35.1 / Morning Headaches G44.221 / Difficulty concentrating R41.840 / Memory problems or poor judgment G31.84 / Personality changes or irritability R45.4 / Loud snoring R06.83 / Depression F32.9 / Unrefreshed by sleep G47.8 / Impotence N52.9 / History of high blood pressure R03.0 / Insomnia G47.00    I understand that I am proceeding with a home sleep apnea test as ordered by my treating physician. I understand that untreated sleep apnea is a serious cardiovascular risk factor and it is my responsibility to perform the test and seek management for sleep apnea. I will be contacted with the results and be managed for sleep apnea by a local  sleep physician. I will be receiving equipment and further instructions from [facility]. I shall promptly ship back the equipment via the included mailing label. I understand my insurance will be billed for the test and as the patient I am responsible for any insurance related out-of-pocket costs incurred. I have been provided with written instructions and can call for additional video or telephonic instruction, with 24-hour availability of qualified personnel to answer any questions: Patient Help Desk [phone].  Patient Signature ______________________________________________________   Date______________________ Patient Telemedicine Verbal Consent

## 2023-12-06 ENCOUNTER — Other Ambulatory Visit: Payer: Self-pay | Admitting: Family

## 2023-12-19 ENCOUNTER — Telehealth: Payer: Self-pay | Admitting: Internal Medicine

## 2023-12-19 NOTE — Telephone Encounter (Signed)
 Called to r/s appt on 11/7

## 2024-01-03 ENCOUNTER — Encounter: Admitting: Internal Medicine

## 2024-01-10 ENCOUNTER — Encounter: Payer: Self-pay | Admitting: Cardiology

## 2024-02-15 ENCOUNTER — Other Ambulatory Visit: Payer: Self-pay | Admitting: Family

## 2024-03-11 ENCOUNTER — Telehealth: Payer: Self-pay | Admitting: Family

## 2024-03-12 MED ORDER — SACUBITRIL-VALSARTAN 49-51 MG PO TABS: 1.0000 | ORAL_TABLET | Freq: Two times a day (BID) | ORAL | 1 refills | Status: AC

## 2024-03-12 MED ORDER — FUROSEMIDE 40 MG PO TABS: 40.0000 mg | ORAL_TABLET | Freq: Every day | ORAL | 1 refills | Status: AC

## 2024-03-12 MED ORDER — FARXIGA 10 MG PO TABS: 10.0000 mg | ORAL_TABLET | Freq: Every day | ORAL | 1 refills | Status: AC

## 2024-03-12 MED ORDER — ATORVASTATIN CALCIUM 40 MG PO TABS: 40.0000 mg | ORAL_TABLET | Freq: Every day | ORAL | 1 refills | Status: AC

## 2024-03-12 MED ORDER — METOPROLOL SUCCINATE ER 100 MG PO TB24: 50.0000 mg | ORAL_TABLET | Freq: Every day | ORAL | 1 refills | Status: AC

## 2024-03-12 MED ORDER — SPIRONOLACTONE 25 MG PO TABS: 25.0000 mg | ORAL_TABLET | Freq: Every day | ORAL | 1 refills | Status: AC

## 2024-03-12 NOTE — Telephone Encounter (Signed)
 Meds sent to pharmacy.

## 2024-08-12 ENCOUNTER — Encounter: Admitting: Family Medicine
